# Patient Record
Sex: Male | Born: 1985 | Hispanic: Yes | Marital: Married | Smoking: Current some day smoker
Health system: Southern US, Community
[De-identification: ages and names within clinical notes are randomized; demographics above are authoritative.]

## PROBLEM LIST (undated history)

## (undated) ENCOUNTER — Emergency Department (HOSPITAL_COMMUNITY): Admission: EM | Payer: Self-pay

## (undated) DIAGNOSIS — K5792 Diverticulitis of intestine, part unspecified, without perforation or abscess without bleeding: Secondary | ICD-10-CM

## (undated) DIAGNOSIS — J309 Allergic rhinitis, unspecified: Secondary | ICD-10-CM

## (undated) DIAGNOSIS — K635 Polyp of colon: Secondary | ICD-10-CM

## (undated) DIAGNOSIS — K219 Gastro-esophageal reflux disease without esophagitis: Secondary | ICD-10-CM

## (undated) DIAGNOSIS — R1312 Dysphagia, oropharyngeal phase: Secondary | ICD-10-CM

## (undated) DIAGNOSIS — E119 Type 2 diabetes mellitus without complications: Secondary | ICD-10-CM

## (undated) HISTORY — DX: Allergic rhinitis, unspecified: J30.9

## (undated) HISTORY — DX: Gastro-esophageal reflux disease without esophagitis: K21.9

## (undated) HISTORY — DX: Polyp of colon: K63.5

## (undated) HISTORY — DX: Dysphagia, oropharyngeal phase: R13.12

## (undated) HISTORY — PX: ABDOMINAL HYSTERECTOMY: SHX81

---

## 2017-02-03 ENCOUNTER — Encounter (HOSPITAL_COMMUNITY): Payer: Self-pay | Admitting: Emergency Medicine

## 2017-02-03 ENCOUNTER — Emergency Department (HOSPITAL_COMMUNITY)
Admission: EM | Admit: 2017-02-03 | Discharge: 2017-02-03 | Disposition: A | Discharge status: Home / Self Care | Payer: Self-pay | Attending: Emergency Medicine | Admitting: Emergency Medicine

## 2017-02-03 DIAGNOSIS — R350 Frequency of micturition: Secondary | ICD-10-CM | POA: Insufficient documentation

## 2017-02-03 DIAGNOSIS — R631 Polydipsia: Secondary | ICD-10-CM | POA: Insufficient documentation

## 2017-02-03 DIAGNOSIS — E119 Type 2 diabetes mellitus without complications: Secondary | ICD-10-CM | POA: Insufficient documentation

## 2017-02-03 LAB — CBC
HEMATOCRIT: 40.8 % (ref 39.0–52.0)
Hemoglobin: 14.9 g/dL (ref 13.0–17.0)
MCH: 30.5 pg (ref 26.0–34.0)
MCHC: 36.5 g/dL — ABNORMAL HIGH (ref 30.0–36.0)
MCV: 83.4 fL (ref 78.0–100.0)
PLATELETS: 238 10*3/uL (ref 150–400)
RBC: 4.89 MIL/uL (ref 4.22–5.81)
RDW: 12.6 % (ref 11.5–15.5)
WBC: 5.2 10*3/uL (ref 4.0–10.5)

## 2017-02-03 LAB — URINALYSIS, ROUTINE W REFLEX MICROSCOPIC
BACTERIA UA: NONE SEEN
Bilirubin Urine: NEGATIVE
Ketones, ur: 20 mg/dL — AB
Leukocytes, UA: NEGATIVE
Nitrite: NEGATIVE
PROTEIN: NEGATIVE mg/dL
Specific Gravity, Urine: 1.033 — ABNORMAL HIGH (ref 1.005–1.030)
pH: 5 (ref 5.0–8.0)

## 2017-02-03 LAB — I-STAT VENOUS BLOOD GAS, ED
Acid-base deficit: 3 mmol/L — ABNORMAL HIGH (ref 0.0–2.0)
Bicarbonate: 21.3 mmol/L (ref 20.0–28.0)
O2 SAT: 83 %
PCO2 VEN: 36.4 mmHg — AB (ref 44.0–60.0)
PH VEN: 7.374 (ref 7.250–7.430)
PO2 VEN: 47 mmHg — AB (ref 32.0–45.0)
Patient temperature: 98.3
TCO2: 22 mmol/L (ref 0–100)

## 2017-02-03 LAB — COMPREHENSIVE METABOLIC PANEL
ALT: 58 U/L (ref 17–63)
AST: 32 U/L (ref 15–41)
Albumin: 4.1 g/dL (ref 3.5–5.0)
Alkaline Phosphatase: 98 U/L (ref 38–126)
Anion gap: 14 (ref 5–15)
BUN: 11 mg/dL (ref 6–20)
CHLORIDE: 96 mmol/L — AB (ref 101–111)
CO2: 19 mmol/L — AB (ref 22–32)
CREATININE: 0.91 mg/dL (ref 0.61–1.24)
Calcium: 9.2 mg/dL (ref 8.9–10.3)
GFR calc Af Amer: >60 mL/min (ref >60)
GFR calc non Af Amer: >60 mL/min (ref >60)
Glucose, Bld: 587 mg/dL (ref 65–99)
POTASSIUM: 4.3 mmol/L (ref 3.5–5.1)
SODIUM: 129 mmol/L — AB (ref 135–145)
Total Bilirubin: 1.2 mg/dL (ref 0.3–1.2)
Total Protein: 7.2 g/dL (ref 6.5–8.1)

## 2017-02-03 LAB — LIPASE, BLOOD: LIPASE: 28 U/L (ref 11–51)

## 2017-02-03 LAB — CBG MONITORING, ED
GLUCOSE-CAPILLARY: 282 mg/dL — AB (ref 65–99)
Glucose-Capillary: 459 mg/dL — ABNORMAL HIGH (ref 65–99)

## 2017-02-03 MED ORDER — METFORMIN HCL 500 MG PO TABS
500.0000 mg | ORAL_TABLET | Freq: Once | ORAL | Status: AC
  Administered 2017-02-03: 500 mg via ORAL
  Filled 2017-02-03: qty 1

## 2017-02-03 MED ORDER — SODIUM CHLORIDE 0.9 % IV BOLUS (SEPSIS)
1000.0000 mL | Freq: Once | INTRAVENOUS | Status: AC
  Administered 2017-02-03: 1000 mL via INTRAVENOUS

## 2017-02-03 MED ORDER — METFORMIN HCL 500 MG PO TABS: 500.0000 mg | ORAL_TABLET | Freq: Two times a day (BID) | ORAL | 0 refills | Status: DC

## 2017-02-03 NOTE — ED Provider Notes (Signed)
[facility] DEPT Provider Note   CSN: [id number] Arrival date & time: 02/03/17  1101     History   Chief Complaint Chief Complaint  Patient presents with  . Emesis  . Abdominal Pain  . Urinary Frequency    HPI Joshua Wall is a 31 y.o. male.  HPI   Pt is a 31 yo male with no PMH who presents the ED with multiple complaints. Patient reports over the past 2 weeks he has been having intermittent nausea, vomiting, urinary frequency and increased thirst. He reports only having episodes of vomiting after drinking fluids. He also states he has been having intermittent episodes of lightheadedness and blurred vision that typically occur when he is outside in the sun. He notes over the past 2-3 days he has been having mild diffuse abdominal discomfort but currently denies any specific pain at this time. He also reports having a 20 pound weight loss over the past 2 weeks. Denies taking any medications at home for his symptoms. Denies fever, chills, headache, cough, shortness of breath, chest pain, hematemesis, diarrhea, constipation, dysuria, blood in urine or stool, penile discharge, rash, numbness, tingling, weakness, syncope. Denies any known sick contacts.  History reviewed. No pertinent past medical history.  There are no active problems to display for this patient.   History reviewed. No pertinent surgical history.     Home Medications    Prior to Admission medications   Medication Sig Start Date End Date Taking? Authorizing Provider  metFORMIN (GLUCOPHAGE) 500 MG tablet Take 1 tablet (500 mg total) by mouth 2 (two) times daily with a meal. 02/03/17   Nona Dell, PA-C    Family History History reviewed. No pertinent family history.  Social History Social History  Substance Use Topics  . Smoking status: Never Smoker  . Smokeless tobacco: Never Used  . Alcohol use No     Allergies   Patient has no known allergies.   Review of Systems Review of  Systems  Constitutional: Positive for unexpected weight change.  Eyes: Positive for visual disturbance (blurred).  Gastrointestinal: Positive for abdominal pain, nausea and vomiting.  Genitourinary: Positive for frequency.  Neurological: Positive for light-headedness.  All other systems reviewed and are negative.    Physical Exam Updated Vital Signs BP 132/76   Pulse 68   Temp 98.3 F (36.8 C) (Oral)   Resp 16   Ht 5\' 8"  (1.727 m)   Wt 104.2 kg (229 lb 11.5 oz)   SpO2 97%   BMI 34.93 kg/m   Physical Exam  Constitutional: He is oriented to person, place, and time. He appears well-developed and well-nourished. No distress.  HENT:  Head: Normocephalic and atraumatic.  Mouth/Throat: Oropharynx is clear and moist. No oropharyngeal exudate.  Eyes: Conjunctivae and EOM are normal. Pupils are equal, round, and reactive to light. Right eye exhibits no discharge. Left eye exhibits no discharge. No scleral icterus.  Neck: Normal range of motion. Neck supple.  Cardiovascular: Normal rate, regular rhythm, normal heart sounds and intact distal pulses.   Pulmonary/Chest: Effort normal and breath sounds normal. No respiratory distress. He has no wheezes. He has no rales. He exhibits no tenderness.  Abdominal: Soft. Bowel sounds are normal. He exhibits no distension and no mass. There is no tenderness. There is no rebound and no guarding. No hernia.  Musculoskeletal: Normal range of motion. He exhibits no edema.  Neurological: He is alert and oriented to person, place, and time. He has normal strength. No cranial nerve deficit  or sensory deficit. Coordination normal.  Skin: Skin is warm and dry. He is not diaphoretic.  Nursing note and vitals reviewed.    ED Treatments / Results  Labs (all labs ordered are listed, but only abnormal results are displayed) Labs Reviewed  COMPREHENSIVE METABOLIC PANEL - Abnormal; Notable for the following:       Result Value   Sodium 129 (*)    Chloride 96  (*)    CO2 19 (*)    Glucose, Bld 587 (*)    All other components within normal limits  CBC - Abnormal; Notable for the following:    MCHC 36.5 (*)    All other components within normal limits  URINALYSIS, ROUTINE W REFLEX MICROSCOPIC - Abnormal; Notable for the following:    Color, Urine STRAW (*)    Specific Gravity, Urine 1.033 (*)    Glucose, UA >=500 (*)    Hgb urine dipstick SMALL (*)    Ketones, ur 20 (*)    Squamous Epithelial / LPF 0-5 (*)    All other components within normal limits  CBG MONITORING, ED - Abnormal; Notable for the following:    Glucose-Capillary 459 (*)    All other components within normal limits  I-STAT VENOUS BLOOD GAS, ED - Abnormal; Notable for the following:    pCO2, Ven 36.4 (*)    pO2, Ven 47.0 (*)    Acid-base deficit 3.0 (*)    All other components within normal limits  CBG MONITORING, ED - Abnormal; Notable for the following:    Glucose-Capillary 282 (*)    All other components within normal limits  LIPASE, BLOOD    EKG  EKG Interpretation None       Radiology No results found.  Procedures Procedures (including critical care time)  Medications Ordered in ED Medications  sodium chloride 0.9 % bolus 1,000 mL (0 mLs Intravenous Stopped 02/03/17 1356)  metFORMIN (GLUCOPHAGE) tablet 500 mg (500 mg Oral Given 02/03/17 1544)     Initial Impression / Assessment and Plan / ED Course  I have reviewed the triage vital signs and the nursing notes.  Pertinent labs & imaging results that were available during my care of the patient were reviewed by me and considered in my medical decision making (see chart for details).    Patient presents with multiple symptoms including abdominal pain, nausea, vomiting, lightheadedness, urinary frequency and polydipsia that has been present for the past 2 weeks. Patient denies any known medical history. VSS. Exam unremarkable, abdomen soft and nontender. Remaining exam unremarkable. Patient given IV fluids  in the ED. Glucose 587, no anion gap. UA positive for glucose, ketones, no signs of infection. VBG showed pH 7.3, CO2 36.4, bicarb 21.3. Discussed pt with Dr. Laverta Baltimore. Due to pt with new onset DM, pt without PCP and workup concerning for possible early DKA, will consult hospitalist for further management. I spoke with Dr. Benjamine Mola (internal medicine); due to pt without workup showing evidence of DKA, plan to start pt on metformin with plan for him to f/u at [facility] clinic tomorrow for follow up and further management of new onset DM. Discussed results and plan with pt and family at bedside. Repeat CBG 282. Pt given initial dose of metformin in the ED and d/c home with rx. Discussed return precautions.   Final Clinical Impressions(s) / ED Diagnoses   Final diagnoses:  Diabetes mellitus, new onset (HCC)    New Prescriptions New Prescriptions   METFORMIN (GLUCOPHAGE) 500 MG TABLET  Take 1 tablet (500 mg total) by mouth 2 (two) times daily with a meal.     Nona Dell, PA-C 02/03/17 1552    Margette Fast, MD 02/04/17 [phone]

## 2017-02-03 NOTE — ED Notes (Signed)
Admitting team at bedside.

## 2017-02-03 NOTE — Discharge Instructions (Signed)
Take your medication as prescribed. Follow-up at your scheduled appointment tomorrow at the internal medicine clinic at the [facility]. Please return to the Emergency Department if symptoms worsen or new onset of fever, chest pain, difficulty breathing, abdominal pain, vomiting, unable to keep fluids down, confusion, altered mental status, syncope, seizure.

## 2017-02-03 NOTE — ED Triage Notes (Signed)
Onset 2 weeks ago developed nausea, vomiting, general abdominal pain, weight loss of 20 pounds, near syncope, blurry vision,  urinary frequency, and pain tip of penis. States general abdominal pain 2/10 achy sore.

## 2017-02-03 NOTE — ED Notes (Signed)
Mathis Bud PA notified of critical Glucose 587.

## 2017-02-03 NOTE — ED Notes (Signed)
Patient given something to drink per EDP approval  

## 2017-02-04 ENCOUNTER — Encounter: Payer: Self-pay | Admitting: Internal Medicine

## 2017-02-04 ENCOUNTER — Other Ambulatory Visit (HOSPITAL_COMMUNITY): Admission: RE | Admit: 2017-02-04 | Payer: Self-pay | Source: Ambulatory Visit | Admitting: Internal Medicine

## 2017-02-04 ENCOUNTER — Ambulatory Visit (INDEPENDENT_AMBULATORY_CARE_PROVIDER_SITE_OTHER): Payer: Self-pay | Admitting: Internal Medicine

## 2017-02-04 VITALS — BP 120/82 | HR 91 | Temp 98.0°F | Wt 234.4 lb

## 2017-02-04 DIAGNOSIS — E119 Type 2 diabetes mellitus without complications: Secondary | ICD-10-CM

## 2017-02-04 DIAGNOSIS — B3742 Candidal balanitis: Secondary | ICD-10-CM

## 2017-02-04 LAB — POCT GLYCOSYLATED HEMOGLOBIN (HGB A1C): HEMOGLOBIN A1C: 13.1

## 2017-02-04 LAB — GLUCOSE, CAPILLARY: GLUCOSE-CAPILLARY: 393 mg/dL — AB (ref 65–99)

## 2017-02-04 MED ORDER — INSULIN DETEMIR 100 UNIT/ML ~~LOC~~ SOLN: 10.0000 [IU] | Freq: Every day | SUBCUTANEOUS | 11 refills | Status: DC

## 2017-02-04 MED ORDER — INSULIN GLARGINE 100 UNIT/ML ~~LOC~~ SOLN
10.0000 [IU] | Freq: Once | SUBCUTANEOUS | Status: AC
  Administered 2017-02-04: 10 [IU] via SUBCUTANEOUS

## 2017-02-04 MED ORDER — CLOTRIMAZOLE-BETAMETHASONE 1-0.05 % EX CREA: 1.0000 "application " | TOPICAL_CREAM | Freq: Two times a day (BID) | CUTANEOUS | 0 refills | Status: AC

## 2017-02-04 NOTE — Progress Notes (Signed)
° °  CC: ED f/u HPI:  [name] is a 31 y.o. with recent dx of new onset DM who presents for ED follow up.   On 02/03/17, the pt presented to the [facility] ED with several weeks of increased urinary frequency, thirst, intermittent n/v and blurred vision. He also had a 20 lb weight loss over that time. His initial blood glucose was 587 with urine ketones, though there was no anion gap. He was hydrated with IV fluids and started on Metformin. Bg decreased to 282 prior to discharge.   Since leaving the ED, he reports improved nausea and abd pain, intermittent but improving blurred vision. His urinary frequency has decreased but he continues to feel increased thirst. He has taken two doses of metformin without side effects.   He also notes a recent history of urethral discharge and itchiness. He used an OTC topical medication (unable to remember name) with resolution of urethral discharge but still notes some redness and discomfort with retraction.    Past Medical History: No past medical history reported. No surgical history.  Past Family History: Father positive for HTN, HLD. Otherwise negative.  Social History: Married, sexually active. Occasional alcohol use, quit smoking 6 months after 4 year smoking history, denies other drug use.    Review of Systems:  Review of Systems  Constitutional: Positive for weight loss. Negative for chills and fever.  HENT: Negative for ear discharge, ear pain and hearing loss.   Eyes: Positive for blurred vision. Negative for pain and discharge.  Respiratory: Negative for cough and shortness of breath.   Cardiovascular: Negative for chest pain and palpitations.  Gastrointestinal: Negative for constipation, nausea and vomiting.  Genitourinary: Positive for frequency. Negative for flank pain and hematuria.  Musculoskeletal: Negative for joint pain and myalgias.  Neurological: Negative for tingling, sensory change and focal weakness.  Endo/Heme/Allergies:  Positive for polydipsia. Does not bruise/bleed easily.  Psychiatric/Behavioral: Negative for hallucinations and substance abuse.    Physical Exam:  Vitals:   02/04/17 1518  BP: 120/82  Pulse: 91  Temp: 98 F (36.7 C)  TempSrc: Oral  SpO2: 99%  Weight: 234 lb 6.4 oz (106.3 kg)   Physical Exam  Constitutional: He is oriented to person, place, and time. He appears well-developed and well-nourished.  HENT:  Head: Normocephalic and atraumatic.  Mouth/Throat: Oropharynx is clear and moist. No oropharyngeal exudate.  Eyes: Conjunctivae are normal. Right eye exhibits no discharge. Left eye exhibits no discharge.  Cardiovascular: Normal rate, regular rhythm and intact distal pulses.   Pulmonary/Chest: Effort normal and breath sounds normal. No respiratory distress. He has no wheezes.  Abdominal: Soft. Bowel sounds are normal. He exhibits no distension. There is no tenderness. There is no rebound and no guarding.  Genitourinary:  Genitourinary Comments: Uncircumcised penis with erythema at distal tip, able to fully retract, white discharge around glans penis, no discharge at urethral meatus   Musculoskeletal: Normal range of motion.  Neurological: He is alert and oriented to person, place, and time. No sensory deficit. He exhibits normal muscle tone.  Skin: Skin is warm and dry. Capillary refill takes less than 2 seconds.  Psychiatric: He has a normal mood and affect. His behavior is normal.    Assessment & Plan:   See Encounters Tab for problem based charting.  Patient seen with Dr. Daryll Drown

## 2017-02-04 NOTE — Assessment & Plan Note (Addendum)
Candidal Balanitis Assessment: Candidal balanitis is likely due to recent glucosuria. Whitish discharge mostly around glans rather than from urethral meatus, but will check GC to r/o other STI.   Plan:  -Clotrimazole 1% BID x 7 days -Hygiene -U/A with Urine GC

## 2017-02-04 NOTE — Patient Instructions (Addendum)
It was very nice to meet you this afternoon Joshua Wall  For your insulin, inject 10 U of Levemir insulin each night and check your blood sugar in the morning before breakfast.   If you start to experience stomach pain, nausea, vomiting, dizziness, or other symptoms you had before you went to the Emergency Department, please return to the ED for treatment.   Start to adopt healthy eating habits and exercising. You will be able to meet with a nutrition specialist for more details very soon.   For your yeast infection: Apply clotrimazole 1% twice a day to the head of the penis and affected skin for 1 week. If it has not improved or has worsened after 1 week, come back to the clinic.

## 2017-02-04 NOTE — Assessment & Plan Note (Addendum)
DM Assessment: Newly diagnosed DM2 after presenting to ED. Today, his bg is 393, and A1c is 13.1. He has already been started on Metformin which he is initially tolerating, can consider increasing dose in future. He is also eligible to start insulin therapy which is likely to be most effective and affordable for him and he has been educated on technique of insulin injections and glucose monitoring. Depending on treatment response, lifestyle changes, and financial options, he may be able to transition to other forms of therapy in the future.     Plan: -One time 10 U dose of Lantus in clinic  -Start Levemir 10 U qhs, check bg in am and keep log -Continue Metformin 500 mg BID -Encourage lifestyle changes  -Microalbumin: Cr ratio  -Referral to Diabetes and Nutrition education, Financial services  -F/u in two weeks to reassess

## 2017-02-05 ENCOUNTER — Ambulatory Visit: Payer: Self-pay

## 2017-02-05 LAB — URINE CYTOLOGY ANCILLARY ONLY
Chlamydia: NEGATIVE
NEISSERIA GONORRHEA: NEGATIVE

## 2017-02-05 LAB — MICROSCOPIC EXAMINATION
BACTERIA UA: NONE SEEN
Casts: NONE SEEN /lpf

## 2017-02-05 LAB — URINALYSIS, COMPLETE
BILIRUBIN UA: NEGATIVE
LEUKOCYTES UA: NEGATIVE
NITRITE UA: NEGATIVE
Urobilinogen, Ur: 0.2 mg/dL (ref 0.2–1.0)
pH, UA: 5 (ref 5.0–7.5)

## 2017-02-05 LAB — MICROALBUMIN / CREATININE URINE RATIO
Creatinine, Urine: 67.4 mg/dL
MICROALB/CREAT RATIO: 181.3 mg/g{creat} — AB (ref 0.0–30.0)
Microalbumin, Urine: 122.2 ug/mL

## 2017-02-11 NOTE — Progress Notes (Signed)
Internal Medicine Clinic Attending  I saw and evaluated the patient.  I personally confirmed the key portions of the history and exam documented by Dr. Harden and I reviewed pertinent patient test results.  The assessment, diagnosis, and plan were formulated together and I agree with the documentation in the resident's note.  

## 2017-02-19 ENCOUNTER — Ambulatory Visit: Payer: Self-pay

## 2017-03-05 ENCOUNTER — Encounter: Payer: Self-pay | Admitting: Dietician

## 2017-03-05 ENCOUNTER — Ambulatory Visit (INDEPENDENT_AMBULATORY_CARE_PROVIDER_SITE_OTHER): Payer: Self-pay | Admitting: Internal Medicine

## 2017-03-05 ENCOUNTER — Ambulatory Visit (INDEPENDENT_AMBULATORY_CARE_PROVIDER_SITE_OTHER): Payer: Self-pay | Admitting: Dietician

## 2017-03-05 ENCOUNTER — Ambulatory Visit: Payer: Self-pay

## 2017-03-05 VITALS — BP 112/75 | HR 71 | Temp 97.7°F | Ht 68.5 in | Wt 232.4 lb

## 2017-03-05 DIAGNOSIS — E119 Type 2 diabetes mellitus without complications: Secondary | ICD-10-CM

## 2017-03-05 DIAGNOSIS — Z794 Long term (current) use of insulin: Secondary | ICD-10-CM

## 2017-03-05 DIAGNOSIS — Z713 Dietary counseling and surveillance: Secondary | ICD-10-CM

## 2017-03-05 DIAGNOSIS — Z7984 Long term (current) use of oral hypoglycemic drugs: Secondary | ICD-10-CM

## 2017-03-05 DIAGNOSIS — B3742 Candidal balanitis: Secondary | ICD-10-CM

## 2017-03-05 LAB — GLUCOSE, CAPILLARY: GLUCOSE-CAPILLARY: 186 mg/dL — AB (ref 65–99)

## 2017-03-05 MED ORDER — "INSULIN SYRINGE-NEEDLE U-100 31G X 15/64"" 0.3 ML MISC": 10.0000 [IU] | Freq: Every day | 3 refills | Status: DC

## 2017-03-05 MED ORDER — METFORMIN HCL 1000 MG PO TABS: 1000.0000 mg | ORAL_TABLET | Freq: Two times a day (BID) | ORAL | 3 refills | Status: DC

## 2017-03-05 NOTE — Patient Instructions (Signed)
Your blood sugar was 186 after lunch. This is very close to target after meals of less than 180!  Way to go!  Good job taking care of your diabetes!

## 2017-03-05 NOTE — Assessment & Plan Note (Signed)
The patient has had a great response to his diabetes diagnosis with good dietary and exercise changes. His wife is also very involved and motivated with these changes. He has been using 10 units of Levemir nightly without symptoms of hypoglycemia and a steady decrease his morning blood sugars. His most recent values are in a good target range. His initial labs from prior visit which show some proteinuria, he is normotensive so there is no indication for ACE inhibitor at this time and his proteinuria may improve with improved glycemic control and treatment. He has had no GI side effects from metformin and can tolerate increased dose.   -Increase metformin to 1000 mg twice a day -Continue Levemir 10 units nightly -Continue to encourage lifestyle modifications -Follow-up in 2 months

## 2017-03-05 NOTE — Patient Instructions (Addendum)
Good to see you again Joshua Wall.   Keep up the great work with exercise and eating healthy to help your diabetes. You have refills for your insulin available at the [facility] when you are running low. We also sent in for refill of the Metformin and for syringes.   For the Metformin, start to take a half tablet at breakfast and a full tablet at dinner for one week.   Then, take one full tablet at breakfast and one full tablet at dinner.   I'll see you back in two months to see how you're doing.

## 2017-03-05 NOTE — Progress Notes (Signed)
   CC: Diabetes follow up   HPI:  [name] is a 31 y.o. M with past medical history of diabetes who presents to the clinic for follow-up of his diabetes.   [name] was initially seen for his diabetes 7/2 after being diagnosed with an A1c of 13.1. He was started on 10 units of Levemir nightly, and metformin. Since then he reports he does been feeling well overall and denies symptoms of hypoglycemia. He has blood glucose measurements with him today which reveal a downward trend from his diagnosis until now with most recent readings around 150. He and his wife have also been working with lifestyle modifications by eating a healthier diet and exercise.  He was also diagnosed with candidal balanitis at the time and prescribed topical clotrimazole. He reports a whitish discharge and discomfort have resolved, but there is a small amount of skin breakdown that has not fully healed.   Review of Systems:  Review of Systems  Constitutional: Negative for fever.  Gastrointestinal: Negative for abdominal pain.  Genitourinary: Negative for dysuria and frequency.     Physical Exam:  Vitals:   03/05/17 1539  BP: 112/75  Pulse: 71  Temp: 97.7 F (36.5 C)  TempSrc: Oral  SpO2: 98%  Weight: 232 lb 6.4 oz (105.4 kg)  Height: 5' 8.5" (1.74 m)   Physical Exam  Constitutional: He appears well-developed and well-nourished.  Cardiovascular: Normal rate and regular rhythm.   Pulmonary/Chest: Effort normal and breath sounds normal.  Abdominal: Soft. There is no tenderness.  Skin: Skin is warm and dry. Capillary refill takes less than 2 seconds.    Assessment & Plan:   See Encounters Tab for problem based charting.  Patient seen with Dr. Evette Doffing

## 2017-03-05 NOTE — Assessment & Plan Note (Signed)
His discharge and discomfort have improved with topical clotrimazole. Reported areas of skin breakdown have also improved but still need time to fully heal. Counseled patient that antifungal cream is no longer necessary with resolution of infection.

## 2017-03-05 NOTE — Progress Notes (Signed)
Needs refills on Metformin today. Seeing Doctor and working with Development worker, community.  Diabetes Self-Management Education  Visit Type: First/Initial  Appt. Start Time: 115 Appt. End Time: 215  03/05/2017  [name], identified by name and date of birth, is a 31 y.o. male with a diagnosis of Diabetes: Type 2.  He is here with his wife and daughter, Joshua Wall  ASSESSMENT  Blood pressure 114/82, pulse 81, temperature 98.3 F (36.8 C), temperature source Oral, weight 232 lb 9.6 oz (105.5 kg), SpO2 98 %. Body mass index is 35.37 kg/m.      Diabetes Self-Management Education - 03/05/17 1400      Visit Information   Visit Type First/Initial     Initial Visit   Diabetes Type Type 2   Are you currently following a meal plan? Yes   What type of meal plan do you follow? lower fat, limited carbs   Are you taking your medications as prescribed? Yes     Health Coping   How would you rate your overall health? Good     Psychosocial Assessment   Patient Belief/Attitude about Diabetes Motivated to manage diabetes   Self-care barriers Lack of material resources   Self-management support Doctor's office;Family;CDE visits   Other persons present Spouse/SO;Family Member   Patient Concerns Nutrition/Meal planning   Special Needs None  wife translates for him   Preferred Learning Style No preference indicated   Learning Readiness Change in progress   How often do you need to have someone help you when you read instructions, pamphlets, or other written materials from your doctor or pharmacy? 3 - Sometimes  if it is in [place]     Pre-Education Assessment   Patient understands the diabetes disease and treatment process. Needs Instruction   Patient understands incorporating nutritional management into lifestyle. Needs Instruction   Patient undertands incorporating physical activity into lifestyle. Needs Instruction   Patient understands using medications safely. Needs  Instruction   Patient understands monitoring blood glucose, interpreting and using results Needs Instruction   Patient understands prevention, detection, and treatment of acute complications. Needs Instruction   Patient understands prevention, detection, and treatment of chronic complications. Needs Instruction   Patient understands how to develop strategies to address psychosocial issues. Demonstrates understanding / competency   Patient understands how to develop strategies to promote health/change behavior. Demonstrates understanding / competency      Individualized Plan for Diabetes Self-Management Training:   Learning Objective:  Patient will have a greater understanding of diabetes self-management. Patient education plan is to attend individual and/or group sessions per assessed needs and concerns.  My plan to support myself in continuing these changes to care for my diabetes is to attend or contact:   Type 2 diabetes support group : 2nd Monday of every month from 6-7 PM at [address] [facility] conference room [phone]   Doctor's office, CDE, Microbiologist, pharmacist, church .  Plan:   Patient Instructions  Your blood sugar was 186 after lunch. This is very close to target after meals of less than 180!  Way to go!  Good job taking care of your diabetes!   Expected Outcomes:     Education material provided: Meal plan card  If problems or questions, patient to contact team via:  Phone  Future DSME appointment:   3-4 weeks [place], [place] 03/05/2017 3:03 PM.

## 2017-03-06 NOTE — Progress Notes (Signed)
Internal Medicine Clinic Attending  I saw and evaluated the patient.  I personally confirmed the key portions of the history and exam documented by Dr. Harden and I reviewed pertinent patient test results.  The assessment, diagnosis, and plan were formulated together and I agree with the documentation in the resident's note.  

## 2017-04-01 ENCOUNTER — Ambulatory Visit: Payer: Self-pay | Admitting: Dietician

## 2017-05-13 ENCOUNTER — Encounter: Payer: Self-pay | Admitting: Internal Medicine

## 2017-07-01 ENCOUNTER — Encounter: Payer: Self-pay | Admitting: Internal Medicine

## 2017-07-01 ENCOUNTER — Ambulatory Visit: Payer: Self-pay | Admitting: Internal Medicine

## 2017-07-01 ENCOUNTER — Other Ambulatory Visit: Payer: Self-pay

## 2017-07-01 VITALS — BP 119/73 | HR 68 | Temp 98.0°F | Ht 68.5 in | Wt 252.9 lb

## 2017-07-01 DIAGNOSIS — Z6837 Body mass index (BMI) 37.0-37.9, adult: Secondary | ICD-10-CM

## 2017-07-01 DIAGNOSIS — Z23 Encounter for immunization: Secondary | ICD-10-CM

## 2017-07-01 DIAGNOSIS — B351 Tinea unguium: Secondary | ICD-10-CM

## 2017-07-01 DIAGNOSIS — F17210 Nicotine dependence, cigarettes, uncomplicated: Secondary | ICD-10-CM

## 2017-07-01 DIAGNOSIS — R635 Abnormal weight gain: Secondary | ICD-10-CM

## 2017-07-01 DIAGNOSIS — E119 Type 2 diabetes mellitus without complications: Secondary | ICD-10-CM

## 2017-07-01 DIAGNOSIS — Z7984 Long term (current) use of oral hypoglycemic drugs: Secondary | ICD-10-CM

## 2017-07-01 DIAGNOSIS — R7303 Prediabetes: Secondary | ICD-10-CM

## 2017-07-01 LAB — POCT GLYCOSYLATED HEMOGLOBIN (HGB A1C): Hemoglobin A1C: 5.8

## 2017-07-01 LAB — GLUCOSE, CAPILLARY: Glucose-Capillary: 119 mg/dL — ABNORMAL HIGH (ref 65–99)

## 2017-07-01 MED ORDER — GLUCOSE BLOOD VI STRP: ORAL_STRIP | 12 refills | Status: DC

## 2017-07-01 MED ORDER — TERBINAFINE HCL 250 MG PO TABS: 250.0000 mg | ORAL_TABLET | Freq: Every day | ORAL | 0 refills | Status: AC

## 2017-07-01 MED ORDER — FREESTYLE LANCETS MISC: 12 refills | Status: DC

## 2017-07-01 NOTE — Patient Instructions (Signed)
Great to meet you today.  Your diabetes looks great today your A1C was way down to 5.8.  We will discontinue the Levemir but continue the metformin for now.  I have also written for a medication to help with your toenail fungus.  Fungus likes two things sugar and moisture.  We have taken care of the sugar now we need to concentrate on keeping your feet dry so the medication can work.  Continue taking your blood sugars once a day to keep an eye on your diabetes.  We will see how you've been doing in 3 months.

## 2017-07-01 NOTE — Progress Notes (Signed)
   CC: onchomycosis of bilateral toenails, Weight Gain, Follow up on T2DM  HPI:  [name] is a 31 y.o. with no real PMH other than recently diagnosed T2DM diagnosed in July of this year.  He is here to address some chronic toenail fungus, recent weight gain and his T2DM.  He was seen in the ED in July 2018 for abdominal pain, polyuria, polydipsia and weight loss, was found to have a glucose of 587 and A1C of 13.1.  Since then he has been placed on metformin 2000mg  daily and levemir 10 units daily.  He reports good adherence with this for two months and improvement in blood sugars.  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  He has continued to drastically change his diet and tries to exercise some but not much.    Please see A&P for status of the patient's chronic medical conditions  History reviewed. No pertinent past medical history. Review of Systems:  ROS: Pulmonary: pt denies increased work of breathing, shortness of breath,  Cardiac: pt denies palpitations, chest pain,  Abdominal: pt denies abdominal pain, nausea, vomiting, or diarrhea  Physical Exam:  Vitals:   07/01/17 1513  BP: 119/73  Pulse: 68  Temp: 98 F (36.7 C)  TempSrc: Oral  SpO2: 98%  Weight: 252 lb 14.4 oz (114.7 kg)  Height: 5' 8.5" (1.74 m)   Physical Exam  Constitutional: He appears well-developed and well-nourished.  Eyes: Right eye exhibits no discharge. Left eye exhibits no discharge. No scleral icterus.  Cardiovascular: Normal rate, regular rhythm, normal heart sounds and intact distal pulses. Exam reveals no gallop and no friction rub.  No murmur heard. Pulmonary/Chest: Effort normal and breath sounds normal. No respiratory distress. He has no wheezes. He has no rales.  Abdominal: Soft. Bowel sounds are  normal. He exhibits no distension and no mass. There is no tenderness. There is no guarding.  Neurological: He is alert.  Skin:  onchomycosis of bilateral first and second toenails.    Social History   Socioeconomic History  . Marital status: Married    Spouse name: Not on file  . Number of children: Not on file  . Years of education: Not on file  . Highest education level: Not on file  Social Needs  . Financial resource strain: Not on file  . Food insecurity - worry: Not on file  . Food insecurity - inability: Not on file  . Transportation needs - medical: Not on file  . Transportation needs - non-medical: Not on file  Occupational History  . Not on file  Tobacco Use  . Smoking status: Current Some Day Smoker    Types: Cigarettes  . Smokeless tobacco: Never Used  . Tobacco comment: 3 cigarettes per week.  Substance and Sexual Activity  . Alcohol use: Yes    Comment: Sometimes.  . Drug use: No  . Sexual activity: Not on file  Other Topics Concern  . Not on file  Social History Narrative  . Not on file    History reviewed. No pertinent family history.  Assessment & Plan:   See Encounters Tab for problem based charting.  Patient seen with Dr. Rebeca Alert

## 2017-07-02 DIAGNOSIS — B351 Tinea unguium: Secondary | ICD-10-CM | POA: Insufficient documentation

## 2017-07-02 NOTE — Progress Notes (Signed)
Internal Medicine Clinic Attending  I saw and evaluated the patient.  I personally confirmed the key portions of the history and exam documented by Dr. Shan Levans and I reviewed pertinent patient test results.  The assessment, diagnosis, and plan were formulated together and I agree with the documentation in the resident's note.  Startling reversal in his A1C from 13 to 5.8, despite being off medication for the past 2 months. Will continue metformin for prediabetes but can stop insulin for now since he has not been using it. Will have him continue to check sugars and return in 3 months for recheck of A1C.  Oda Kilts, MD

## 2017-07-02 NOTE — Assessment & Plan Note (Signed)
A1C is 5.8 today down from 13.1 in July.  He was seen in the ED in July 2018 for abdominal pain, polyuria, polydipsia and weight loss, was found to have a glucose of 587 and A1C of 13.1.  Since then he has been placed on metformin 2000mg  daily and levemir 10 units daily.  He reports good adherence with this for two months and improvement in blood sugars.  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  He has continued to drastically change his diet and tries to exercise some but not much. He also reports weight gain but on his visit to the ED he reported a 20 pound weight loss before that visit and he is now 20 pounds heavier.  I expect that he has regained his original weight once his metabolic activity normalized.  I feel that this dramatic reversal of pts hyperglycemia is due to reversal of the patient's glucose toxicity of the pancreas.  Given the patient's young age, when he changed his diet and took metformin/levemir for those initial 2 months I expect he allowed his pancreas to recover and begin making insulin again.    -Pt still in prediabetes although on no meds for last two months. -Will continue metformin 2000mg  daily and discontinue levemir for now and have pt follow up in 3 months while checking his blood sugars at home daily.

## 2017-07-08 ENCOUNTER — Ambulatory Visit: Payer: Self-pay

## 2017-07-16 ENCOUNTER — Ambulatory Visit: Payer: Self-pay

## 2017-08-12 ENCOUNTER — Ambulatory Visit: Payer: Self-pay

## 2017-11-04 DIAGNOSIS — K5792 Diverticulitis of intestine, part unspecified, without perforation or abscess without bleeding: Secondary | ICD-10-CM

## 2017-11-04 HISTORY — DX: Diverticulitis of intestine, part unspecified, without perforation or abscess without bleeding: K57.92

## 2017-11-20 ENCOUNTER — Inpatient Hospital Stay (HOSPITAL_COMMUNITY)
Admission: EM | Admit: 2017-11-20 | Discharge: 2017-11-24 | DRG: 392 | Disposition: A | Discharge status: Home / Self Care | Payer: Medicaid Other | Attending: Internal Medicine | Admitting: Internal Medicine

## 2017-11-20 ENCOUNTER — Other Ambulatory Visit: Payer: Self-pay

## 2017-11-20 ENCOUNTER — Emergency Department (HOSPITAL_COMMUNITY): Payer: Medicaid Other

## 2017-11-20 ENCOUNTER — Encounter (HOSPITAL_COMMUNITY): Payer: Self-pay | Admitting: Emergency Medicine

## 2017-11-20 DIAGNOSIS — K5792 Diverticulitis of intestine, part unspecified, without perforation or abscess without bleeding: Secondary | ICD-10-CM | POA: Diagnosis present

## 2017-11-20 DIAGNOSIS — Z23 Encounter for immunization: Secondary | ICD-10-CM

## 2017-11-20 DIAGNOSIS — K5720 Diverticulitis of large intestine with perforation and abscess without bleeding: Principal | ICD-10-CM | POA: Diagnosis present

## 2017-11-20 DIAGNOSIS — E119 Type 2 diabetes mellitus without complications: Secondary | ICD-10-CM

## 2017-11-20 DIAGNOSIS — R7881 Bacteremia: Secondary | ICD-10-CM | POA: Diagnosis present

## 2017-11-20 DIAGNOSIS — B351 Tinea unguium: Secondary | ICD-10-CM | POA: Diagnosis present

## 2017-11-20 DIAGNOSIS — E876 Hypokalemia: Secondary | ICD-10-CM | POA: Diagnosis present

## 2017-11-20 DIAGNOSIS — Z8719 Personal history of other diseases of the digestive system: Secondary | ICD-10-CM | POA: Diagnosis present

## 2017-11-20 DIAGNOSIS — Z7984 Long term (current) use of oral hypoglycemic drugs: Secondary | ICD-10-CM

## 2017-11-20 DIAGNOSIS — D72829 Elevated white blood cell count, unspecified: Secondary | ICD-10-CM | POA: Diagnosis present

## 2017-11-20 DIAGNOSIS — F17210 Nicotine dependence, cigarettes, uncomplicated: Secondary | ICD-10-CM | POA: Diagnosis present

## 2017-11-20 HISTORY — DX: Type 2 diabetes mellitus without complications: E11.9

## 2017-11-20 HISTORY — DX: Diverticulitis of intestine, part unspecified, without perforation or abscess without bleeding: K57.92

## 2017-11-20 LAB — CBC WITH DIFFERENTIAL/PLATELET
Basophils Absolute: 0 10*3/uL (ref 0.0–0.1)
Basophils Relative: 0 %
EOS ABS: 0 10*3/uL (ref 0.0–0.7)
Eosinophils Relative: 0 %
HCT: 42.3 % (ref 39.0–52.0)
HEMOGLOBIN: 15.4 g/dL (ref 13.0–17.0)
LYMPHS ABS: 1.8 10*3/uL (ref 0.7–4.0)
Lymphocytes Relative: 14 %
MCH: 31 pg (ref 26.0–34.0)
MCHC: 36.4 g/dL — ABNORMAL HIGH (ref 30.0–36.0)
MCV: 85.1 fL (ref 78.0–100.0)
Monocytes Absolute: 0.7 10*3/uL (ref 0.1–1.0)
Monocytes Relative: 5 %
NEUTROS PCT: 81 %
Neutro Abs: 10.6 10*3/uL — ABNORMAL HIGH (ref 1.7–7.7)
Platelets: 224 10*3/uL (ref 150–400)
RBC: 4.97 MIL/uL (ref 4.22–5.81)
RDW: 13.3 % (ref 11.5–15.5)
WBC: 13.1 10*3/uL — AB (ref 4.0–10.5)

## 2017-11-20 LAB — URINALYSIS, ROUTINE W REFLEX MICROSCOPIC
BILIRUBIN URINE: NEGATIVE
Bacteria, UA: NONE SEEN
GLUCOSE, UA: NEGATIVE mg/dL
Hgb urine dipstick: NEGATIVE
KETONES UR: 5 mg/dL — AB
Leukocytes, UA: NEGATIVE
Nitrite: NEGATIVE
PROTEIN: 30 mg/dL — AB
Specific Gravity, Urine: 1.018 (ref 1.005–1.030)
pH: 6 (ref 5.0–8.0)

## 2017-11-20 LAB — COMPREHENSIVE METABOLIC PANEL
ALBUMIN: 4.1 g/dL (ref 3.5–5.0)
ALT: 37 U/L (ref 17–63)
AST: 21 U/L (ref 15–41)
Alkaline Phosphatase: 60 U/L (ref 38–126)
Anion gap: 12 (ref 5–15)
BUN: 6 mg/dL (ref 6–20)
CHLORIDE: 101 mmol/L (ref 101–111)
CO2: 20 mmol/L — ABNORMAL LOW (ref 22–32)
Calcium: 8.9 mg/dL (ref 8.9–10.3)
Creatinine, Ser: 0.75 mg/dL (ref 0.61–1.24)
GFR calc Af Amer: >60 mL/min (ref >60)
Glucose, Bld: 127 mg/dL — ABNORMAL HIGH (ref 65–99)
POTASSIUM: 3.3 mmol/L — AB (ref 3.5–5.1)
Sodium: 133 mmol/L — ABNORMAL LOW (ref 135–145)
Total Bilirubin: 1 mg/dL (ref 0.3–1.2)
Total Protein: 8.5 g/dL — ABNORMAL HIGH (ref 6.5–8.1)

## 2017-11-20 LAB — I-STAT CG4 LACTIC ACID, ED: LACTIC ACID, VENOUS: 1.68 mmol/L (ref 0.5–1.9)

## 2017-11-20 MED ORDER — SODIUM CHLORIDE 0.9 % IV BOLUS
1000.0000 mL | Freq: Once | INTRAVENOUS | Status: AC
  Administered 2017-11-20: 1000 mL via INTRAVENOUS

## 2017-11-20 MED ORDER — IOPAMIDOL (ISOVUE-300) INJECTION 61%
INTRAVENOUS | Status: AC
  Filled 2017-11-20: qty 100

## 2017-11-20 MED ORDER — FENTANYL CITRATE (PF) 100 MCG/2ML IJ SOLN
100.0000 ug | Freq: Once | INTRAMUSCULAR | Status: AC
  Administered 2017-11-20: 100 ug via INTRAVENOUS
  Filled 2017-11-20: qty 2

## 2017-11-20 MED ORDER — ONDANSETRON HCL 4 MG/2ML IJ SOLN
4.0000 mg | Freq: Once | INTRAMUSCULAR | Status: AC
  Administered 2017-11-20: 4 mg via INTRAVENOUS
  Filled 2017-11-20: qty 2

## 2017-11-20 NOTE — ED Provider Notes (Signed)
[facility] EMERGENCY DEPARTMENT Provider Note   CSN: [id number] Arrival date & time: 11/20/17  1805     History   Chief Complaint Chief Complaint  Patient presents with  . Abdominal Pain  . Emesis  . Diarrhea  . Headache    HPI Joshua Wall is a 32 y.o. male.  The history is provided by the patient and the spouse. A language interpreter was used [phone]).  Abdominal Pain   This is a new problem. The current episode started yesterday. The problem occurs constantly. The problem has been gradually worsening. The pain is located in the RLQ. The pain is moderate. Associated symptoms include nausea. Pertinent negatives include dysuria. The symptoms are aggravated by palpation. Nothing relieves the symptoms.  Headache   Associated symptoms include nausea.  For the past 24 hours been having increasing abdominal pain.  He also reports fevers.  He reports nausea, but no vomiting and no diarrhea.  He reports minimal cough.  He reports body aches and headache.  No travel.  No rash  Past Medical History:  Diagnosis Date  . Diabetes mellitus without complication [facility])     Patient Active Problem List   Diagnosis Date Noted  . Toenail fungus 07/02/2017  . Type 2 diabetes mellitus without complication, without long-term current use of insulin ([place]) 02/04/2017  . Candidal balanitis 02/04/2017    History reviewed. No pertinent surgical history.      Home Medications    Prior to Admission medications   Medication Sig Start Date End Date Taking? Authorizing Provider  glucose blood (FREESTYLE PRECISION NEO TEST) test strip Use as instructed 07/01/17   Katherine Roan, MD  Insulin Syringe-Needle U-100 31G X 15/64" 0.3 ML MISC Inject 10 Units as directed at bedtime. 03/05/17   Tawny Asal, MD  Lancets (FREESTYLE) lancets Use as instructed 07/01/17   Katherine Roan, MD  metFORMIN (GLUCOPHAGE) 1000 MG tablet Take 1 tablet (1,000 mg total) by mouth 2 (two)  times daily with a meal. 03/05/17   Tawny Asal, MD    Family History History reviewed. No pertinent family history.  Social History Social History   Tobacco Use  . Smoking status: Former Smoker    Types: Cigarettes  . Smokeless tobacco: Never Used  . Tobacco comment: 3 cigarettes per week.  Substance Use Topics  . Alcohol use: Yes    Comment: Sometimes.  . Drug use: No     Allergies   Patient has no known allergies.   Review of Systems Review of Systems  Constitutional: Positive for fatigue.  Respiratory: Positive for cough.   Gastrointestinal: Positive for abdominal pain and nausea.  Genitourinary: Negative for dysuria.  Skin: Negative for rash.  All other systems reviewed and are negative.    Physical Exam Updated Vital Signs BP (!) 124/112 (BP Location: Right Arm)   Pulse (!) 117   Temp (!) 100.5 F (38.1 C) (Oral)   Resp 16   Wt 97.5 kg (215 lb)   SpO2 98%   BMI 32.22 kg/m   Physical Exam  CONSTITUTIONAL: Well developed/well nourished HEAD: Normocephalic/atraumatic EYES: EOMI/PERRL ENMT: Mucous membranes moist NECK: supple no meningeal signs SPINE/BACK:entire spine nontender CV: S1/S2 noted, no murmurs/rubs/gallops noted LUNGS: Lungs are clear to auscultation bilaterally, no apparent distress ABDOMEN: soft, moderate right lower quadrant tenderness, no rebound or guarding, bowel sounds noted throughout abdomen GU:no cva tenderness, no testicular tenderness, no hernias noted.  Family at bedside per patient request NEURO: Pt is awake/alert/appropriate, moves  all extremitiesx4.  No facial droop.   EXTREMITIES: pulses normal/equal, full ROM SKIN: warm, color normal, no rash PSYCH: no abnormalities of mood noted, alert and oriented to situation  ED Treatments / Results  Labs (all labs ordered are listed, but only abnormal results are displayed) Labs Reviewed  COMPREHENSIVE METABOLIC PANEL - Abnormal; Notable for the following components:       Result Value   Sodium 133 (*)    Potassium 3.3 (*)    CO2 20 (*)    Glucose, Bld 127 (*)    Total Protein 8.5 (*)    All other components within normal limits  CBC WITH DIFFERENTIAL/PLATELET - Abnormal; Notable for the following components:   WBC 13.1 (*)    MCHC 36.4 (*)    Neutro Abs 10.6 (*)    All other components within normal limits  URINALYSIS, ROUTINE W REFLEX MICROSCOPIC - Abnormal; Notable for the following components:   Ketones, ur 5 (*)    Protein, ur 30 (*)    Squamous Epithelial / LPF 0-5 (*)    All other components within normal limits  CULTURE, BLOOD (ROUTINE X 2)  CULTURE, BLOOD (ROUTINE X 2)  I-STAT CG4 LACTIC ACID, ED    EKG None  Radiology Dg Chest 2 View  Result Date: 11/20/2017 CLINICAL DATA:  Fever and tachycardia EXAM: CHEST - 2 VIEW COMPARISON:  None. FINDINGS: The heart size and mediastinal contours are within normal limits. Both lungs are clear. The visualized skeletal structures are unremarkable. IMPRESSION: No active cardiopulmonary disease. Electronically Signed   By: Franchot Gallo M.D.   On: 11/20/2017 19:40    Procedures .Critical Care Performed by: Ripley Fraise, MD Authorized by: Ripley Fraise, MD   Critical care provider statement:    Critical care time (minutes):  56   Critical care start time:  11/21/2017 12:04 AM   Critical care end time:  11/21/2017 1:00 AM   Critical care time was exclusive of:  Separately billable procedures and treating other patients   Critical care was necessary to treat or prevent imminent or life-threatening deterioration of the following conditions:  Sepsis   Critical care was time spent personally by me on the following activities:  Evaluation of patient's response to treatment, examination of patient, re-evaluation of patient's condition, ordering and review of radiographic studies, ordering and review of laboratory studies, discussions with consultants, obtaining history from patient or surrogate, pulse  oximetry and development of treatment plan with patient or surrogate   I assumed direction of critical care for this patient from another provider in my specialty: no      Medications Ordered in ED Medications  iopamidol (ISOVUE-300) 61 % injection (has no administration in time range)  piperacillin-tazobactam (ZOSYN) IVPB 3.375 g (3.375 g Intravenous New Bag/Given 11/21/17 0119)  sodium chloride 0.9 % bolus 1,000 mL (1,000 mLs Intravenous New Bag/Given 11/21/17 0119)  fentaNYL (SUBLIMAZE) injection 50 mcg (has no administration in time range)  sodium chloride 0.9 % bolus 1,000 mL (0 mLs Intravenous Stopped 11/21/17 0109)  ondansetron (ZOFRAN) injection 4 mg (4 mg Intravenous Given 11/20/17 2345)  fentaNYL (SUBLIMAZE) injection 100 mcg (100 mcg Intravenous Given 11/20/17 2345)  iopamidol (ISOVUE-300) 61 % injection 100 mL (100 mLs Intravenous Contrast Given 11/21/17 0014)     Initial Impression / Assessment and Plan / ED Course  I have reviewed the triage vital signs and the nursing notes.  Pertinent labs & imaging results that were available during my care of the patient were  reviewed by me and considered in my medical decision making (see chart for details).     11:58 PM Plan to obtain CT imaging to evaluate for appendicitis or other acute abdominal emergency.  He denies any vomiting or diarrhea to me when using interpreter 12:59 AM Obtained phone call from radiology.  Patient is noted to have diverticulitis with microperforation.  IV fluids and IV antibiotics ordered.  Will consult medicine for admission.  This appears to be a contained perforation, will likely not need emergent surgery at this time  Patient feels improved at this time 1:21 AM Updated patient and his wife on plan with interpreter.  I discussed the case with internal medicine for admission. Final Clinical Impressions(s) / ED Diagnoses   Final diagnoses:  Diverticulitis    ED Discharge Orders    None         Ripley Fraise, MD 11/21/17 [phone]

## 2017-11-20 NOTE — ED Triage Notes (Signed)
Pt presents with lower abd pain with decreased urination, n/v/d x 2 days; currently has fever and tachycardia in triage

## 2017-11-21 ENCOUNTER — Emergency Department (HOSPITAL_COMMUNITY): Payer: Medicaid Other

## 2017-11-21 ENCOUNTER — Encounter (HOSPITAL_COMMUNITY): Payer: Self-pay | Admitting: Radiology

## 2017-11-21 ENCOUNTER — Other Ambulatory Visit: Payer: Self-pay

## 2017-11-21 DIAGNOSIS — B351 Tinea unguium: Secondary | ICD-10-CM | POA: Diagnosis not present

## 2017-11-21 DIAGNOSIS — R7881 Bacteremia: Secondary | ICD-10-CM | POA: Diagnosis not present

## 2017-11-21 DIAGNOSIS — E876 Hypokalemia: Secondary | ICD-10-CM | POA: Diagnosis not present

## 2017-11-21 DIAGNOSIS — Z87891 Personal history of nicotine dependence: Secondary | ICD-10-CM

## 2017-11-21 DIAGNOSIS — E119 Type 2 diabetes mellitus without complications: Secondary | ICD-10-CM | POA: Diagnosis not present

## 2017-11-21 DIAGNOSIS — R109 Unspecified abdominal pain: Secondary | ICD-10-CM | POA: Diagnosis not present

## 2017-11-21 DIAGNOSIS — Z23 Encounter for immunization: Secondary | ICD-10-CM | POA: Diagnosis not present

## 2017-11-21 DIAGNOSIS — F17210 Nicotine dependence, cigarettes, uncomplicated: Secondary | ICD-10-CM | POA: Diagnosis not present

## 2017-11-21 DIAGNOSIS — D72829 Elevated white blood cell count, unspecified: Secondary | ICD-10-CM | POA: Diagnosis not present

## 2017-11-21 DIAGNOSIS — K5720 Diverticulitis of large intestine with perforation and abscess without bleeding: Secondary | ICD-10-CM | POA: Diagnosis not present

## 2017-11-21 DIAGNOSIS — Z7984 Long term (current) use of oral hypoglycemic drugs: Secondary | ICD-10-CM | POA: Diagnosis not present

## 2017-11-21 DIAGNOSIS — Z8719 Personal history of other diseases of the digestive system: Secondary | ICD-10-CM | POA: Diagnosis present

## 2017-11-21 DIAGNOSIS — K5792 Diverticulitis of intestine, part unspecified, without perforation or abscess without bleeding: Secondary | ICD-10-CM | POA: Diagnosis present

## 2017-11-21 LAB — BASIC METABOLIC PANEL
Anion gap: 11 (ref 5–15)
BUN: 8 mg/dL (ref 6–20)
CALCIUM: 8.1 mg/dL — AB (ref 8.9–10.3)
CHLORIDE: 100 mmol/L — AB (ref 101–111)
CO2: 22 mmol/L (ref 22–32)
Creatinine, Ser: 0.78 mg/dL (ref 0.61–1.24)
Glucose, Bld: 143 mg/dL — ABNORMAL HIGH (ref 65–99)
Potassium: 3.4 mmol/L — ABNORMAL LOW (ref 3.5–5.1)
SODIUM: 133 mmol/L — AB (ref 135–145)

## 2017-11-21 LAB — CBC
HCT: 37.2 % — ABNORMAL LOW (ref 39.0–52.0)
Hemoglobin: 13.2 g/dL (ref 13.0–17.0)
MCH: 30.5 pg (ref 26.0–34.0)
MCHC: 35.5 g/dL (ref 30.0–36.0)
MCV: 85.9 fL (ref 78.0–100.0)
PLATELETS: 204 10*3/uL (ref 150–400)
RBC: 4.33 MIL/uL (ref 4.22–5.81)
RDW: 13.1 % (ref 11.5–15.5)
WBC: 12.1 10*3/uL — AB (ref 4.0–10.5)

## 2017-11-21 LAB — HEMOGLOBIN A1C
HEMOGLOBIN A1C: 5.8 % — AB (ref 4.8–5.6)
MEAN PLASMA GLUCOSE: 119.76 mg/dL

## 2017-11-21 LAB — GLUCOSE, CAPILLARY
Glucose-Capillary: 143 mg/dL — ABNORMAL HIGH (ref 65–99)
Glucose-Capillary: 125 mg/dL — ABNORMAL HIGH (ref 65–99)
Glucose-Capillary: 143 mg/dL — ABNORMAL HIGH (ref 65–99)
Glucose-Capillary: 101 mg/dL — ABNORMAL HIGH (ref 65–99)

## 2017-11-21 LAB — HIV ANTIBODY (ROUTINE TESTING W REFLEX): HIV SCREEN 4TH GENERATION: NONREACTIVE

## 2017-11-21 MED ORDER — POTASSIUM CHLORIDE 10 MEQ/100ML IV SOLN
10.0000 meq | INTRAVENOUS | Status: AC
  Administered 2017-11-21 (×2): 10 meq via INTRAVENOUS
  Filled 2017-11-21 (×2): qty 100

## 2017-11-21 MED ORDER — LACTATED RINGERS IV SOLN
INTRAVENOUS | Status: DC
  Administered 2017-11-21 – 2017-11-22 (×4): via INTRAVENOUS

## 2017-11-21 MED ORDER — ACETAMINOPHEN 650 MG RE SUPP: 650.0000 mg | Freq: Four times a day (QID) | RECTAL | Status: DC | PRN

## 2017-11-21 MED ORDER — SODIUM CHLORIDE 0.9 % IV BOLUS
1000.0000 mL | Freq: Once | INTRAVENOUS | Status: AC
  Administered 2017-11-21: 1000 mL via INTRAVENOUS

## 2017-11-21 MED ORDER — ACETAMINOPHEN 325 MG PO TABS
650.0000 mg | ORAL_TABLET | Freq: Four times a day (QID) | ORAL | Status: DC | PRN
  Administered 2017-11-21 – 2017-11-22 (×3): 650 mg via ORAL
  Filled 2017-11-21 (×3): qty 2

## 2017-11-21 MED ORDER — PIPERACILLIN-TAZOBACTAM 3.375 G IVPB 30 MIN
3.3750 g | Freq: Once | INTRAVENOUS | Status: AC
  Administered 2017-11-21: 3.375 g via INTRAVENOUS
  Filled 2017-11-21: qty 50

## 2017-11-21 MED ORDER — FENTANYL CITRATE (PF) 100 MCG/2ML IJ SOLN
50.0000 ug | Freq: Once | INTRAMUSCULAR | Status: AC
  Administered 2017-11-21: 50 ug via INTRAVENOUS
  Filled 2017-11-21: qty 2

## 2017-11-21 MED ORDER — PNEUMOCOCCAL VAC POLYVALENT 25 MCG/0.5ML IJ INJ
0.5000 mL | INJECTION | INTRAMUSCULAR | Status: AC
  Administered 2017-11-22: 0.5 mL via INTRAMUSCULAR
  Filled 2017-11-21: qty 0.5

## 2017-11-21 MED ORDER — ONDANSETRON HCL 4 MG/2ML IJ SOLN
4.0000 mg | Freq: Four times a day (QID) | INTRAMUSCULAR | Status: DC | PRN
  Administered 2017-11-21 – 2017-11-23 (×4): 4 mg via INTRAVENOUS
  Filled 2017-11-21 (×4): qty 2

## 2017-11-21 MED ORDER — HEPARIN SODIUM (PORCINE) 5000 UNIT/ML IJ SOLN
5000.0000 [IU] | Freq: Three times a day (TID) | INTRAMUSCULAR | Status: DC
  Administered 2017-11-21 – 2017-11-24 (×8): 5000 [IU] via SUBCUTANEOUS
  Filled 2017-11-21 (×8): qty 1

## 2017-11-21 MED ORDER — CIPROFLOXACIN IN D5W 400 MG/200ML IV SOLN
400.0000 mg | Freq: Two times a day (BID) | INTRAVENOUS | Status: DC
  Administered 2017-11-21 – 2017-11-24 (×7): 400 mg via INTRAVENOUS
  Filled 2017-11-21 (×8): qty 200

## 2017-11-21 MED ORDER — INSULIN ASPART 100 UNIT/ML ~~LOC~~ SOLN
0.0000 [IU] | Freq: Three times a day (TID) | SUBCUTANEOUS | Status: DC
  Administered 2017-11-21 – 2017-11-24 (×8): 1 [IU] via SUBCUTANEOUS

## 2017-11-21 MED ORDER — METRONIDAZOLE IN NACL 5-0.79 MG/ML-% IV SOLN
500.0000 mg | Freq: Three times a day (TID) | INTRAVENOUS | Status: DC
  Administered 2017-11-21 – 2017-11-24 (×11): 500 mg via INTRAVENOUS
  Filled 2017-11-21 (×13): qty 100

## 2017-11-21 MED ORDER — KETOROLAC TROMETHAMINE 15 MG/ML IJ SOLN
15.0000 mg | Freq: Once | INTRAMUSCULAR | Status: AC
  Administered 2017-11-21: 15 mg via INTRAVENOUS
  Filled 2017-11-21: qty 1

## 2017-11-21 MED ORDER — IOPAMIDOL (ISOVUE-300) INJECTION 61%
100.0000 mL | Freq: Once | INTRAVENOUS | Status: AC | PRN
  Administered 2017-11-21: 100 mL via INTRAVENOUS

## 2017-11-21 MED ORDER — MORPHINE SULFATE (PF) 4 MG/ML IV SOLN
2.0000 mg | INTRAVENOUS | Status: DC | PRN
  Administered 2017-11-21 (×3): 4 mg via INTRAVENOUS
  Administered 2017-11-22: 2 mg via INTRAVENOUS
  Administered 2017-11-22 (×2): 4 mg via INTRAVENOUS
  Filled 2017-11-21 (×7): qty 1

## 2017-11-21 MED ORDER — SODIUM CHLORIDE 0.9 % IV SOLN
2.0000 g | INTRAVENOUS | Status: DC
  Administered 2017-11-21: 2 g via INTRAVENOUS
  Filled 2017-11-21: qty 20

## 2017-11-21 MED ORDER — ONDANSETRON HCL 4 MG PO TABS: 4.0000 mg | ORAL_TABLET | Freq: Four times a day (QID) | ORAL | Status: DC | PRN

## 2017-11-21 NOTE — ED Notes (Signed)
Patient transported to CT 

## 2017-11-21 NOTE — H&P (Signed)
Date: 11/21/2017               Patient Name:  Joshua Wall MRN: [id number]  DOB: September 17, 1985 Age / Sex: 32 y.o., male   PCP: Katherine Roan, MD         Medical Service: Internal Medicine Teaching Service         Attending Physician: Dr. Aldine Contes, MD    First Contact: Dr. Johny Chess Pager: [phone]  Second Contact: Dr. Heber  Pager: [phone]       After Hours (After 5p/  First Contact Pager: [phone]  weekends / holidays): Second Contact Pager: [phone]   Chief Complaint: abdominal pain  History of Present Illness:  32 yo male with PMH of T2DM presenting with two days of abdominal pain with nausea and vomiting. Patient states he was in his normal state of health until 2 days prior to admission when he developed sharp/stinging lower abdominal worse in the RLQ than the LLQ. He has associated nausea, vomiting, fever, and headache. He has had decreased PO intake the past 2 days. He denies diarrhea, dysuria, hematuria, chest pain, or SOB. He has never had this pain before. The pain became unbearable and he decided to come to the ED to be evaluated.   He does endorse experiencing hard stools and straining to have a bowel movement. He states he typically has a bowel movement daily. Some days his stools are soft and other days they are hard and require straining. He denies history of previous abdominal surgeries.   ED Course: Vitals: BP 124/77, pulse (!) 122, temperature (!) 102.6 F (39.2C), resp. rate 16, SpO2 97 % on RA. Labs: Na 133, K 3.3, glucose 127; LFTs WNL; WBC 13., LA 1.68 Meds: zosyn, fentanyl, zofran, 2  liter NS Imaging: CT abdomen pelvis demonstrated acute diverticulitis, with soft tissue inflammation at the mid sigmoid colon, inflamed diverticula and minimal free air tracking into the adjacent mesentery, reflecting microperforation  Meds:  Current Meds  Medication Sig  . dimenhyDRINATE (DRAMAMINE) 50 MG tablet Take 50 mg by mouth every 8 (eight) hours as  needed for nausea.  . diphenhydramine-acetaminophen (TYLENOL PM) 25-500 MG TABS tablet Take 1 tablet by mouth daily as needed (for pain).  . metFORMIN (GLUCOPHAGE) 1000 MG tablet Take 1 tablet (1,000 mg total) by mouth 2 (two) times daily with a meal.     Allergies: Allergies as of 11/20/2017  . (No Known Allergies)   Past Medical History:  Diagnosis Date  . Diabetes mellitus without complication ([place])     Family History:  History reviewed. No pertinent family history.  Social History:  Social History   Tobacco Use  . Smoking status: Former Smoker    Types: Cigarettes  . Smokeless tobacco: Never Used  . Tobacco comment: 3 cigarettes per week.  Substance Use Topics  . Alcohol use: Yes    Comment: Sometimes.  . Drug use: No    Review of Systems: A complete ROS was negative except as per HPI.   Physical Exam: Blood pressure 123/74, pulse (!) 102, temperature (!) 100.5 F (38.1 C), temperature source Oral, resp. rate 16, weight 215 lb (97.5 kg), SpO2 93 %. Physical Exam  Constitutional: He is oriented to person, place, and time. He appears well-developed and well-nourished.  HENT:  Head: Normocephalic and atraumatic.  Neck: Normal range of motion. Neck supple.  Cardiovascular: Regular rhythm, normal heart sounds and intact distal pulses. Tachycardia present.  Pulmonary/Chest: Effort normal and breath sounds normal.  No respiratory distress. He has no wheezes.  Abdominal: Soft. Bowel sounds are normal. He exhibits no distension. There is tenderness (RLQ>LLQ). There is no rebound and no guarding.  Musculoskeletal: Normal range of motion. He exhibits no edema.  Neurological: He is alert and oriented to person, place, and time. No cranial nerve deficit.  Skin: Skin is warm and dry.    EKG: none to review  CXR: personally reviewed my interpretation is negative for focal opacity or pleural effusion. Negative for active cardiopulmonary disease  Assessment & Plan by  Problem: Active Problems:   Acute diverticulitis  Acute Diverticulitis Patient presenting febrile, tachycardic with 2 day history of RLQ abdominal pain and nausea/vomiting. CT scan of the abdomen demonstrated acute diverticulitis of the mid sigmoid colon with microperforation. Perforation appears contained, no need for emergent surgery at this time. Abdominal exam soft, no rigidity, TTP in RLQ>LLQ, no rebound tenderness, bowel sounds normal. Patient received 1x dose of Zosyn in ED.  -Will discontinue Zosyn -Start Ceftriaxone and flagyl   -Diet: NPO -IVF: LR 125 cc/hr  -Pain management: morphine 2-4 mg q4 PRN -Zofran PRN for nausea -Blood cultures obtained and pending   Type 2 DM Most recent A1C 5.8. Home regimen includes metformin 1000 mg BID. -CBG monitoring  -SSI sensitive -Hemoglobin A1C pending   Hypokalemia Potassium 3.3 on admission.  -Repleting -Repeat BMP in AM   Dispo: Admit patient to Inpatient with expected length of stay greater than 2 midnights.  Signed: Melanee Spry, MD 11/21/2017, 1:47 AM  Pager: [phone]

## 2017-11-21 NOTE — Progress Notes (Signed)
   Subjective: No acute events overnight following admission.  Reports his abdominal pain is still present but improved since admission.  No bowel movements and no appetite at this point.  Objective:  Vital signs in last 24 hours: Vitals:   11/21/17 0600 11/21/17 0802 11/21/17 0836 11/21/17 1257  BP: (!) 102/54 129/69 122/78 123/75  Pulse: 84 83 88 88  Resp:  20 (!) 22 18  Temp:  99.5 F (37.5 C) 99.4 F (37.4 C) 99.9 F (37.7 C)  TempSrc:  Oral Oral Oral  SpO2: 95% 94% 98% 96%  Weight:       General: Resting in bed, mildly uncomfortably but no acute distress CV: RRR, no murmur appreciated  Resp: Clear anterior breath sounds bilaterally, normal work of breathing, no distress  Abd: Soft, +BS, obese, tenderness to palpation of bilateral lower abdominal quadrants, worse on R with no guarding or rebound tenderness  Extr: No LE edema  Neuro: Alert and oriented x3  Skin: Warm, dry      Assessment/Plan:  Acute Diverticulitis Presented with sudden onset of progressive lower abdominal pain with nausea, vomiting, fevers.  CT showed acute diverticulitis around the mid sigmoid: With evidence of micro perforation, no abscess.  He was started on IV antibiotics with IV fluids and pain control.  Currently n.p.o. for bowel rest, will advance diet as tolerated.  If he fails to improve as expected or worsens, will consider repeat scanning or surgical input. Will transition to equivalent oral abx as status improves. --Monitor vital signs, fever curve --NPO, advance to clears as tolerated --Cont IV Metronidazole 500 mg q8hr --Transition to IV Cipro 400 mg q12hr --Cont IVF- LR 125 cc/hr --Pain Control: Morphine 2-4 mg q4hr prn  --Anti-emetic: Zofran 4 mg q6hr prn    Dispo: Anticipated discharge in approximately 2-3 day(s).   Tawny Asal, MD 11/21/2017, 1:34 PM Pager: [phone]

## 2017-11-21 NOTE — Care Management Note (Signed)
Case Management Note  Patient Details  Name: Joshua Wall MRN: [id number] Date of Birth: 12/21/1985  Subjective/Objective:                    Action/Plan:  Will continue to follow for discharge needs. Will need [place] letter at discharge to assist with prescriptions. Expected Discharge Date:                  Expected Discharge Plan:  Home/Self Care  In-House Referral:  Financial Counselor  Discharge planning Services  [place] [facility], Medication Assistance  Post Acute Care Choice:  NA Choice offered to:     DME Arranged:  N/A DME Agency:  NA  HH Arranged:  NA HH Agency:  NA  Status of Service:  In process, will continue to follow  If discussed at Long Length of Stay Meetings, dates discussed:    Additional Comments:  Marilu Favre, RN 11/21/2017, 10:34 AM

## 2017-11-21 NOTE — Progress Notes (Signed)
MD made aware of the patient increased temp and chest pain. VSS. No new order noted, will continue to monitor.

## 2017-11-22 DIAGNOSIS — R350 Frequency of micturition: Secondary | ICD-10-CM

## 2017-11-22 LAB — CBC
HEMATOCRIT: 35 % — AB (ref 39.0–52.0)
HEMOGLOBIN: 12.1 g/dL — AB (ref 13.0–17.0)
MCH: 29.7 pg (ref 26.0–34.0)
MCHC: 34.6 g/dL (ref 30.0–36.0)
MCV: 85.8 fL (ref 78.0–100.0)
Platelets: 189 10*3/uL (ref 150–400)
RBC: 4.08 MIL/uL — ABNORMAL LOW (ref 4.22–5.81)
RDW: 12.8 % (ref 11.5–15.5)
WBC: 9.1 10*3/uL (ref 4.0–10.5)

## 2017-11-22 LAB — GLUCOSE, CAPILLARY
GLUCOSE-CAPILLARY: 121 mg/dL — AB (ref 65–99)
GLUCOSE-CAPILLARY: 139 mg/dL — AB (ref 65–99)
Glucose-Capillary: 126 mg/dL — ABNORMAL HIGH (ref 65–99)
Glucose-Capillary: 94 mg/dL (ref 65–99)

## 2017-11-22 LAB — BASIC METABOLIC PANEL
ANION GAP: 11 (ref 5–15)
BUN: 7 mg/dL (ref 6–20)
CHLORIDE: 100 mmol/L — AB (ref 101–111)
CO2: 24 mmol/L (ref 22–32)
Calcium: 8.4 mg/dL — ABNORMAL LOW (ref 8.9–10.3)
Creatinine, Ser: 0.84 mg/dL (ref 0.61–1.24)
GFR calc Af Amer: >60 mL/min (ref >60)
GFR calc non Af Amer: >60 mL/min (ref >60)
GLUCOSE: 129 mg/dL — AB (ref 65–99)
POTASSIUM: 3.3 mmol/L — AB (ref 3.5–5.1)
Sodium: 135 mmol/L (ref 135–145)

## 2017-11-22 LAB — PHOSPHORUS: PHOSPHORUS: 2.8 mg/dL (ref 2.5–4.6)

## 2017-11-22 LAB — MAGNESIUM: Magnesium: 1.8 mg/dL (ref 1.7–2.4)

## 2017-11-22 MED ORDER — POTASSIUM CHLORIDE 10 MEQ/100ML IV SOLN
10.0000 meq | INTRAVENOUS | Status: AC
  Administered 2017-11-22 (×3): 10 meq via INTRAVENOUS
  Filled 2017-11-22 (×3): qty 100

## 2017-11-22 NOTE — Progress Notes (Signed)
   Subjective: No acute events overnight, states abdominal pain is improved compared to admission, states it continues to fluctuate. Does feel more of an appetite and would like to attempt liquids today. Has had some diarrhea and also inquires about urinating frequently, feeling of bladder feeling feel and discomfort--no issues voiding and it improves afterward. Questions answered, urination/bladder likely related to fluid administration.   Objective:  Vital signs in last 24 hours: Vitals:   11/21/17 1551 11/21/17 1925 11/21/17 2204 11/22/17 0451  BP: 120/82  127/69 99/63  Pulse: 95  85 89  Resp: 20  18 18   Temp: (!) 103.1 F (39.5 C) 99.7 F (37.6 C) 99.2 F (37.3 C) 98.6 F (37 C)  TempSrc: Oral Oral Oral Oral  SpO2: 98%  98% 96%  Weight:       General: Resting in bed comfortably, improved from prior but no acute distress CV: RRR, no murmur appreciated  Resp: Normal work of breathing, no distress  Abd: Soft, +BS, obese, tenderness to palpation of bilateral lower abdominal quadrants, worse on R with no guarding or rebound tenderness. Stable to somewhat improved exam  Extr: No LE edema  GU: No abnormal scrotal swelling or tenderness  Neuro: Alert and oriented x3  Skin: Warm, dry      Assessment/Plan:  Acute Diverticulitis Presented with sudden onset of progressive lower abdominal pain with nausea, vomiting, fevers.  CT showed acute diverticulitis around the mid sigmoid: With evidence of micro perforation, no abscess.  He was started on IV antibiotics with IV fluids and pain control. His pain has improved this morning, will advance diet to clears and monitor response. Will transition to equivalent oral abx as status improves. --Monitor vital signs, fever curve --Clear Liquid diet, advance as tolerated  --Cont IV Metronidazole 500 mg q8hr and IV Cipro 400 mg q12hr --DC fluids with oral intake  --Pain Control: Morphine 2-4 mg q4hr prn, to oral as improves   --Anti-emetic: Zofran  4 mg q6hr prn    Dispo: Anticipated discharge in approximately 1-2 day(s).   Tawny Asal, MD 11/22/2017, 10:29 AM Pager: [phone]

## 2017-11-23 DIAGNOSIS — R7881 Bacteremia: Secondary | ICD-10-CM | POA: Diagnosis present

## 2017-11-23 DIAGNOSIS — K5792 Diverticulitis of intestine, part unspecified, without perforation or abscess without bleeding: Secondary | ICD-10-CM

## 2017-11-23 LAB — BLOOD CULTURE ID PANEL (REFLEXED)
ACINETOBACTER BAUMANNII: NOT DETECTED
CANDIDA ALBICANS: NOT DETECTED
CANDIDA GLABRATA: NOT DETECTED
CANDIDA KRUSEI: NOT DETECTED
CANDIDA TROPICALIS: NOT DETECTED
Candida parapsilosis: NOT DETECTED
ENTEROBACTER CLOACAE COMPLEX: NOT DETECTED
ESCHERICHIA COLI: NOT DETECTED
Enterobacteriaceae species: NOT DETECTED
Enterococcus species: NOT DETECTED
Haemophilus influenzae: NOT DETECTED
KLEBSIELLA PNEUMONIAE: NOT DETECTED
Klebsiella oxytoca: NOT DETECTED
Listeria monocytogenes: NOT DETECTED
Neisseria meningitidis: NOT DETECTED
PROTEUS SPECIES: NOT DETECTED
Pseudomonas aeruginosa: NOT DETECTED
STAPHYLOCOCCUS SPECIES: NOT DETECTED
STREPTOCOCCUS PNEUMONIAE: NOT DETECTED
Serratia marcescens: NOT DETECTED
Staphylococcus aureus (BCID): NOT DETECTED
Streptococcus agalactiae: NOT DETECTED
Streptococcus pyogenes: NOT DETECTED
Streptococcus species: NOT DETECTED

## 2017-11-23 LAB — BASIC METABOLIC PANEL
Anion gap: 11 (ref 5–15)
BUN: 6 mg/dL (ref 6–20)
CHLORIDE: 102 mmol/L (ref 101–111)
CO2: 23 mmol/L (ref 22–32)
Calcium: 8.7 mg/dL — ABNORMAL LOW (ref 8.9–10.3)
Creatinine, Ser: 0.76 mg/dL (ref 0.61–1.24)
GFR calc Af Amer: >60 mL/min (ref >60)
GFR calc non Af Amer: >60 mL/min (ref >60)
GLUCOSE: 136 mg/dL — AB (ref 65–99)
POTASSIUM: 3.3 mmol/L — AB (ref 3.5–5.1)
Sodium: 136 mmol/L (ref 135–145)

## 2017-11-23 LAB — GLUCOSE, CAPILLARY
GLUCOSE-CAPILLARY: 111 mg/dL — AB (ref 65–99)
GLUCOSE-CAPILLARY: 101 mg/dL — AB (ref 65–99)
Glucose-Capillary: 121 mg/dL — ABNORMAL HIGH (ref 65–99)
Glucose-Capillary: 135 mg/dL — ABNORMAL HIGH (ref 65–99)

## 2017-11-23 LAB — CBC
HEMATOCRIT: 36.8 % — AB (ref 39.0–52.0)
HEMOGLOBIN: 12.8 g/dL — AB (ref 13.0–17.0)
MCH: 29.6 pg (ref 26.0–34.0)
MCHC: 34.8 g/dL (ref 30.0–36.0)
MCV: 85.2 fL (ref 78.0–100.0)
Platelets: 247 10*3/uL (ref 150–400)
RBC: 4.32 MIL/uL (ref 4.22–5.81)
RDW: 12.6 % (ref 11.5–15.5)
WBC: 6.9 10*3/uL (ref 4.0–10.5)

## 2017-11-23 MED ORDER — OXYCODONE HCL 5 MG PO TABS: 5.0000 mg | ORAL_TABLET | ORAL | Status: DC | PRN

## 2017-11-23 MED ORDER — OXYCODONE HCL 5 MG PO TABS
5.0000 mg | ORAL_TABLET | Freq: Four times a day (QID) | ORAL | Status: DC | PRN
  Administered 2017-11-23 – 2017-11-24 (×3): 5 mg via ORAL
  Filled 2017-11-23 (×3): qty 1

## 2017-11-23 MED ORDER — IBUPROFEN 600 MG PO TABS
600.0000 mg | ORAL_TABLET | Freq: Four times a day (QID) | ORAL | Status: DC | PRN
  Administered 2017-11-23: 600 mg via ORAL
  Filled 2017-11-23: qty 1

## 2017-11-23 NOTE — Progress Notes (Signed)
PHARMACY - PHYSICIAN COMMUNICATION CRITICAL VALUE ALERT - BLOOD CULTURE IDENTIFICATION (BCID)  Joshua Wall is an 32 y.o. male who presented to [facility] on 11/20/2017 with a chief complaint of  abdominal pain  Assessment: pt clinically improving of cipro and flagyl  Name of physician (or Provider) Contacted: Johny Chess  Current antibiotics: Cipro/Flagy  Changes to prescribed antibiotics recommended:  Patient is on recommended antibiotics - No changes needed  Results for orders placed or performed during the hospital encounter of 11/20/17  Blood Culture ID Panel (Reflexed) (Collected: 11/20/2017  6:47 PM)  Result Value Ref Range   Enterococcus species NOT DETECTED NOT DETECTED   Listeria monocytogenes NOT DETECTED NOT DETECTED   Staphylococcus species NOT DETECTED NOT DETECTED   Staphylococcus aureus NOT DETECTED NOT DETECTED   Streptococcus species NOT DETECTED NOT DETECTED   Streptococcus agalactiae NOT DETECTED NOT DETECTED   Streptococcus pneumoniae NOT DETECTED NOT DETECTED   Streptococcus pyogenes NOT DETECTED NOT DETECTED   Acinetobacter baumannii NOT DETECTED NOT DETECTED   Enterobacteriaceae species NOT DETECTED NOT DETECTED   Enterobacter cloacae complex NOT DETECTED NOT DETECTED   Escherichia coli NOT DETECTED NOT DETECTED   Klebsiella oxytoca NOT DETECTED NOT DETECTED   Klebsiella pneumoniae NOT DETECTED NOT DETECTED   Proteus species NOT DETECTED NOT DETECTED   Serratia marcescens NOT DETECTED NOT DETECTED   Haemophilus influenzae NOT DETECTED NOT DETECTED   Neisseria meningitidis NOT DETECTED NOT DETECTED   Pseudomonas aeruginosa NOT DETECTED NOT DETECTED   Candida albicans NOT DETECTED NOT DETECTED   Candida glabrata NOT DETECTED NOT DETECTED   Candida krusei NOT DETECTED NOT DETECTED   Candida parapsilosis NOT DETECTED NOT DETECTED   Candida tropicalis NOT DETECTED NOT DETECTED    [name] 11/23/2017  2:29 PM

## 2017-11-23 NOTE — Progress Notes (Signed)
   Subjective: Patient was evaluated on rounds this morning. He reports improvement in his abdominal pain, however is still requiring pain medications. He states he has been able to tolerate a clear liquid diet. He denies nausea/vomiting or fever/chills.  Objective:  Vital signs in last 24 hours: Vitals:   11/22/17 0451 11/22/17 1353 11/22/17 2121 11/23/17 0533  BP: 99/63 125/82 101/87 128/80  Pulse: 89 85 82 75  Resp: 18 18 18 18   Temp: 98.6 F (37 C) 98.6 F (37 C) 98.7 F (37.1 C) 98 F (36.7 C)  TempSrc: Oral Oral Oral Oral  SpO2: 96% 97% 99% 99%  Weight:       Physical Exam  Constitutional: He is well-developed, well-nourished, and in no distress.  Cardiovascular: Normal rate, regular rhythm and normal heart sounds. Exam reveals no gallop and no friction rub.  No murmur heard. Pulmonary/Chest: Effort normal and breath sounds normal. No respiratory distress. He has no wheezes. He has no rales.  Abdominal: Soft. Bowel sounds are normal. He exhibits no distension and no mass. There is tenderness. There is no rebound and no guarding.  Skin: No erythema.     Assessment/Plan:  Active Problems:   Acute diverticulitis   Diverticulitis  Acute diverticulitis Patient shows continued improvement. Leukocytosis has resolved and patient has been afebrile for the past 48 hours.  Exam with mild tenderness to palpation and normal bowel sounds present. He was able to tolerate a liquid diet yesterday. Will advance this today to a regular diet. Will continue with IV antibiotics until ready for discharge. Will change pain meds from IV to oral today. Will continue with Zofran. Will continue to monitor for continued improvement. -regular diet -Tylenol,ibuprofen or oxycodone for pain -Zofran -IV Cipro and Flagyl  Type 2 diabetes Mrs. Recent hemoglobin A1c this admission was 5.8. Patient is currently on metformin.  uring admission patient has been placed on sliding scale insulin and CBGs have  been well controlled.  Dispo: Anticipated discharge 0-1 days.   Kalman Shan [place], DO 11/23/2017, 12:16 PM Pager: [phone]

## 2017-11-24 ENCOUNTER — Encounter: Payer: Self-pay | Admitting: Internal Medicine

## 2017-11-24 DIAGNOSIS — R799 Abnormal finding of blood chemistry, unspecified: Secondary | ICD-10-CM

## 2017-11-24 LAB — CBC
HCT: 37.7 % — ABNORMAL LOW (ref 39.0–52.0)
Hemoglobin: 13.1 g/dL (ref 13.0–17.0)
MCH: 29.8 pg (ref 26.0–34.0)
MCHC: 34.7 g/dL (ref 30.0–36.0)
MCV: 85.7 fL (ref 78.0–100.0)
Platelets: 308 10*3/uL (ref 150–400)
RBC: 4.4 MIL/uL (ref 4.22–5.81)
RDW: 12.8 % (ref 11.5–15.5)
WBC: 6.2 10*3/uL (ref 4.0–10.5)

## 2017-11-24 LAB — BASIC METABOLIC PANEL
Anion gap: 12 (ref 5–15)
BUN: 9 mg/dL (ref 6–20)
CO2: 23 mmol/L (ref 22–32)
Calcium: 9 mg/dL (ref 8.9–10.3)
Chloride: 102 mmol/L (ref 101–111)
Creatinine, Ser: 0.79 mg/dL (ref 0.61–1.24)
GFR calc Af Amer: >60 mL/min (ref >60)
GFR calc non Af Amer: >60 mL/min (ref >60)
Glucose, Bld: 130 mg/dL — ABNORMAL HIGH (ref 65–99)
Potassium: 3.3 mmol/L — ABNORMAL LOW (ref 3.5–5.1)
Sodium: 137 mmol/L (ref 135–145)

## 2017-11-24 LAB — GLUCOSE, CAPILLARY
GLUCOSE-CAPILLARY: 139 mg/dL — AB (ref 65–99)
Glucose-Capillary: 134 mg/dL — ABNORMAL HIGH (ref 65–99)

## 2017-11-24 MED ORDER — METRONIDAZOLE 500 MG PO TABS: 500.0000 mg | ORAL_TABLET | Freq: Three times a day (TID) | ORAL | 0 refills | Status: DC

## 2017-11-24 MED ORDER — POLYETHYLENE GLYCOL 3350 17 G PO PACK: 17.0000 g | PACK | Freq: Every day | ORAL | 0 refills | Status: DC

## 2017-11-24 MED ORDER — CIPROFLOXACIN HCL 500 MG PO TABS: 500.0000 mg | ORAL_TABLET | Freq: Two times a day (BID) | ORAL | 0 refills | Status: DC

## 2017-11-24 NOTE — Progress Notes (Signed)
   Subjective: No acute events overnight, he reports continued improvement of abdominal pain which fluctuates, bowel movements have improved and more normal. Interpreter used for interview and updates. Wife updated later in the morning.   Objective:  Vital signs in last 24 hours: Vitals:   11/23/17 0533 11/23/17 1625 11/23/17 2220 11/24/17 0359  BP: 128/80 131/81 123/83 (!) 130/92  Pulse: 75 85 63 70  Resp: 18   15  Temp: 98 F (36.7 C) 98.4 F (36.9 C) 97.8 F (36.6 C) 99.8 F (37.7 C)  TempSrc: Oral Oral Oral Oral  SpO2: 99% 98% 100% 96%  Weight:       General: Resting in bed comfortably, no distress CV: RRR, no murmur appreciated  Resp: Normal work of breathing, no distress  Abd: Soft, +BS, obese, minimal residual tenderness to palpation of lower quadrants Extr: No LE edema  Neuro: Alert and oriented x3  Skin: Warm, dry      Assessment/Plan:  Acute Diverticulitis Presented with sudden onset of progressive lower abdominal pain with nausea, vomiting, fevers.  CT showed acute diverticulitis around the mid sigmoid: He has improved with IV abx, pain control. Has been afebrile, leukocytosis has resolved and he is now tolerating a regular diet. One out of two blood cultures resulted positive with GNRs, no species- he is on appropriate abx and rpt cultures drawn. Likely stable for discharge today on equivalent oral abx for a total of 7 day course.  --Cont regular diet  --Transition to oral abx for discharge  --oxycodone 5 q6hr prn  --Zofran 4 mg q6hr prn    Dispo: Anticipated discharge in approximately 0-1 day(s).   Tawny Asal, MD 11/24/2017, 8:39 AM Pager: [phone]

## 2017-11-25 ENCOUNTER — Ambulatory Visit (INDEPENDENT_AMBULATORY_CARE_PROVIDER_SITE_OTHER): Payer: Self-pay | Admitting: Internal Medicine

## 2017-11-25 ENCOUNTER — Encounter: Payer: Self-pay | Admitting: Internal Medicine

## 2017-11-25 VITALS — BP 135/82 | HR 89 | Temp 98.1°F | Wt 247.6 lb

## 2017-11-25 DIAGNOSIS — E119 Type 2 diabetes mellitus without complications: Secondary | ICD-10-CM

## 2017-11-25 DIAGNOSIS — K5792 Diverticulitis of intestine, part unspecified, without perforation or abscess without bleeding: Secondary | ICD-10-CM

## 2017-11-25 DIAGNOSIS — K5780 Diverticulitis of intestine, part unspecified, with perforation and abscess without bleeding: Secondary | ICD-10-CM

## 2017-11-25 DIAGNOSIS — Z87891 Personal history of nicotine dependence: Secondary | ICD-10-CM

## 2017-11-25 DIAGNOSIS — Z7984 Long term (current) use of oral hypoglycemic drugs: Secondary | ICD-10-CM

## 2017-11-25 LAB — CULTURE, BLOOD (ROUTINE X 2)
Culture: NO GROWTH
SPECIAL REQUESTS: ADEQUATE
SPECIAL REQUESTS: ADEQUATE

## 2017-11-25 MED ORDER — LACTULOSE 20 GM/30ML PO SOLN: 30.0000 mL | ORAL | 0 refills | Status: DC | PRN

## 2017-11-25 MED ORDER — TRAMADOL HCL 50 MG PO TABS: 50.0000 mg | ORAL_TABLET | Freq: Two times a day (BID) | ORAL | 0 refills | Status: DC | PRN

## 2017-11-25 MED ORDER — CIPROFLOXACIN HCL 500 MG PO TABS: 500.0000 mg | ORAL_TABLET | Freq: Two times a day (BID) | ORAL | 0 refills | Status: DC

## 2017-11-25 MED ORDER — METRONIDAZOLE 500 MG PO TABS: 500.0000 mg | ORAL_TABLET | Freq: Three times a day (TID) | ORAL | 0 refills | Status: DC

## 2017-11-25 NOTE — Progress Notes (Signed)
CC: follow up of T2DM, Diverticulitis  HPI:  [name] is a 32 y.o. male with PMH below, he was just released from the hospital, he was treated for diverticulitis.  He reports doing overall well, he is still having abdominal pain but it has improved.  He has not noticed any fevers or chills, he had a bad episode of pain last night.    Please see A&P for status of the patient's chronic medical conditions  Past Medical History:  Diagnosis Date  . Acute diverticulitis 11/2017  . Diabetes mellitus without complication ([place])    Review of Systems:  ROS: Pulmonary: pt denies increased work of breathing, shortness of breath,  Cardiac: pt denies palpitations, chest pain,  Abdominal:continue abd pain, no vomiting   Physical Exam:  Vitals:   11/25/17 1421  BP: 135/82  Pulse: 89  Temp: 98.1 F (36.7 C)  TempSrc: Oral  SpO2: 100%  Weight: 247 lb 9.6 oz (112.3 kg)   Physical Exam  Constitutional: He is oriented to person, place, and time. He appears well-developed and well-nourished.  Eyes: Right eye exhibits no discharge. Left eye exhibits no discharge. No scleral icterus.  Cardiovascular: Normal rate, regular rhythm, normal heart sounds and intact distal pulses. Exam reveals no gallop and no friction rub.  No murmur heard. Pulmonary/Chest: Effort normal and breath sounds normal. No respiratory distress. He has no wheezes. He has no rales.  Abdominal: Soft. Bowel sounds are normal. He exhibits no distension and no mass. There is tenderness (LLQ). There is no guarding.  Neurological: He is alert and oriented to person, place, and time.    Social History   Socioeconomic History  . Marital status: Married    Spouse name: Not on file  . Number of children: Not on file  . Years of education: Not on file  . Highest education level: Not on file  Occupational History  . Not on file  Social Needs  . Financial resource strain: Not on file  . Food insecurity:    Worry: Not  on file    Inability: Not on file  . Transportation needs:    Medical: Not on file    Non-medical: Not on file  Tobacco Use  . Smoking status: Former Smoker    Types: Cigarettes  . Smokeless tobacco: Never Used  . Tobacco comment: 3 cigarettes per week.  Substance and Sexual Activity  . Alcohol use: Yes    Comment: Sometimes.  . Drug use: No  . Sexual activity: Not on file  Lifestyle  . Physical activity:    Days per week: Not on file    Minutes per session: Not on file  . Stress: Not on file  Relationships  . Social connections:    Talks on phone: Not on file    Gets together: Not on file    Attends religious service: Not on file    Active member of club or organization: Not on file    Attends meetings of clubs or organizations: Not on file    Relationship status: Not on file  . Intimate partner violence:    Fear of current or ex partner: Not on file    Emotionally abused: Not on file    Physically abused: Not on file    Forced sexual activity: Not on file  Other Topics Concern  . Not on file  Social History Narrative  . Not on file    History reviewed. No pertinent family history.  Assessment &  Plan:   See Encounters Tab for problem based charting.  Patient seen with Dr. Angelia Mould

## 2017-11-25 NOTE — Assessment & Plan Note (Addendum)
Pt admitted on 4/17 for acute diverticulitis.  His case was severe with microperforation and inflammatory change seen on CT.  He received 4 days of IV antibiotics and was sent home on cipro and flagyl for 3 days further or oral therapy.  He continues to have abdominal pain that is severe at times but overall better since his hospitalization.  He denies any fevers or chills,  bloody diarrhea, he has had some small amount of greasy stool but notes that he still is limited to a mostly liquid diet due to pain.    -will add metamucil daily to patient's regimen instructed to take at night or at least two hours after other medication -advised to continue miralax, can back off if metamucil does the job, told to titrate as needed -Gave pt PRN lactulose for refractory constipation -prescribed short course of tramadol 50mg  for breakthrough pain, continue tylenol  -extended abx for 5 more days for total of 12 day course as this was a severe diverticulitis and as pt still having significant pain -instructed pt to receive colonoscopy in 6 weeks (this will be difficult as pt has no insurance) gave pt options getting insurance vs paying in advance with the understanding that they may not be able to afford.   Advised to call anytime for referral if they decide to pay.

## 2017-11-25 NOTE — Patient Instructions (Addendum)
Joshua Wall, you are doing well.  Keep taking your antibiotics, which will help you heal.  You can introduce soft foods and gradually work your way up.  Take the tylenol for pain, I can give you a short course of tramadol to help with the pain.  If the pain gets worse, you develop fever, notice increased bloody diarrhea, please call us.  You will need to start taking a fiber supplement called metamucil please take this at least 2 hours after any other medications preferably at night.  This will help you go to the bathroom.  I will prescribe you another medicine called lactulose to use when constipated.  Take this as needed for refractory constipation. not daily, this can be added on to your miralax and metamucil.  If you are able please obtain insurance or consider paying for a colonoscopy after 6 weeks.  I can write you a referral just give [place] a call whatever you decide.

## 2017-11-25 NOTE — Assessment & Plan Note (Signed)
Lab Results  Component Value Date   HGBA1C 5.8 (H) 11/21/2017   Pt was adhering well to dietary changes and taking metformin daily as prescribed before diverticulitis occurred.  I encouraged pt to be proud of his improvement and that this diverticulitis should not overshadow the drastic improvement in his blood sugar control.  -continue metformin 2000mg  daily.

## 2017-11-26 NOTE — Discharge Summary (Signed)
Name: Joshua Wall MRN: [id number] DOB: 1985/11/15 32 y.o. PCP: Katherine Roan, MD  Date of Admission: 11/20/2017  6:31 PM Date of Discharge: 11/24/2017 Attending Physician: Dr. Lenice Pressman   Discharge Diagnosis: Principal Problem:   Acute diverticulitis Active Problems:   Type 2 diabetes mellitus without complication, without long-term current use of insulin (Joshua Wall)   Diverticulitis   Bacteremia   Discharge Medications: Allergies as of 11/24/2017   No Known Allergies     Medication List    STOP taking these medications   Insulin Syringe-Needle U-100 31G X 15/64" 0.3 ML Misc     TAKE these medications   dimenhyDRINATE 50 MG tablet Commonly known as:  DRAMAMINE Take 50 mg by mouth every 8 (eight) hours as needed for nausea.   diphenhydramine-acetaminophen 25-500 MG Tabs tablet Commonly known as:  TYLENOL PM Take 1 tablet by mouth daily as needed (for pain).   freestyle lancets Use as instructed   glucose blood test strip Commonly known as:  FREESTYLE PRECISION NEO TEST Use as instructed   metFORMIN 1000 MG tablet Commonly known as:  GLUCOPHAGE Take 1 tablet (1,000 mg total) by mouth 2 (two) times daily with a meal.   polyethylene glycol packet Commonly known as:  MIRALAX Take 17 g by mouth daily.       Disposition and follow-up:   Joshua Wall was discharged from [facility] in Good condition.  At the hospital follow up visit please address:  1.  --Assess for continued resolution of abdominal pain and maintaining diet --Encourage and continue to educate on bowel regimen use (discharged with daily miralax recommendation to start) in order to achieve soft BMs with no straining to avoid recurrence  --Ensure repeat blood cultures negative  --Assess onychomycosis-pt inquiring about terbinafine refill --Continue management of DM     2.  Labs / imaging needed at time of follow-up: None   3.  Pending labs/ test needing  follow-up: Blood Cultures  Follow-up Appointments:   Hospital Course by problem list:   Acute Diverticulitis Patient presented with progressive lower abdominal pain with associated nausea, vomiting, subjective fevers, and mild leukocytosis.  CT scan showed acute diverticulitis around the sigmoid colon with evidence of microperforation, no abscess.  He was started on IV antibiotics with ciprofloxacin and metronidazole which were eventually transitioned to equivalent p.o. antibiotics.  He was made n.p.o. for bowel rest, pain control, antiemetics provided.  He gradually improved and diet was advanced as tolerated.  Bowel movements return to normal and he had mild residual pain at time of discharge.  Prescriptions provided for antibiotic courses patient had follow-up in clinic the next day (previously scheduled for routine follow-up).  Positive Blood Culture Initial blood cultures drawn 4/17 and 1 out of 2 positive for gram negative rods which resulted as roseomonas species, appropriately covered with Cipro.  Repeat blood cultures were drawn on 4/20 were negative time of discharge and will continue to be monitored until no growth is finalized.  Type 2 DM Currently well controlled on outpatient regimen of metformin, A1c repeated on admission was 5.8.  Resumed metformin on discharge  Discharge Vitals:   BP (!) 130/92 (BP Location: Right Arm)   Pulse 70   Temp 99.8 F (37.7 C) (Oral)   Resp 15   Wt 215 lb (97.5 kg)   SpO2 96%   BMI 32.22 kg/m   Pertinent Labs, Studies, and Procedures:  CT Abd: Acute diverticulitis, with soft tissue inflammation at the mid sigmoid  colon, inflamed diverticula and minimal free air tracking into the adjacent mesentery, reflecting microperforation. Trace associated free fluid noted. No definite evidence of abscess.  Blood Culture 4/17: One of out of two: ROSEOMONAS SPECIES  Blood Cultures 4/20: No growth x 48 hrs   Discharge Instructions: Discharge  Instructions    Discharge instructions   Complete by:  As directed    Nice to see you Joshua Wall -Your abdominal infection (called diverticulitis) has improved. We will continue the antibiotics for a few more days (through April 24th) to fully treat the issue. The blood tests we took yesterday are not positive right now and we will continue to keep them in the lab for a few days to check  -Take Metronidazole 500 mg three times a day until no more pills are left -Take Ciprofloxacin 500 mg twice a day until no more pills are left -You may still have some pain occasionally. For this, you can take Tylenol 1000 mg every 6-8 hours and/or ibuprofen 800 mg every 8 hours when you need it. This will continue to help decrease the inflammation and pain  -To prevent this from happening again, it is important to have bowel movements without straining and not have constipation. To help this, you can take Miralax once a day. It is usually a powder than you can mix in with water. The goal is to have soft bowel movements with no straining.  -You can keep your routine appointment in the clinic. Your doctor can talk to you about your diabetes, toenails, bowel movements      Signed: Tawny Asal, MD 11/26/2017, 7:38 AM   Pager: [phone]

## 2017-11-26 NOTE — Progress Notes (Signed)
Internal Medicine Clinic Attending  I saw and evaluated the patient.  I personally confirmed the key portions of the history and exam documented by Dr. Shan Levans and I reviewed pertinent patient test results.  The assessment, diagnosis, and plan were formulated together and I agree with the documentation in the resident's note. Still with TTP of LLQ. Discussed expectant care RTC for worsening pain, fevers, hematochezia, would increase length of antibiotics to 68-37 days given complications and continued pain.  Will need colonoscopy in a few weeks.

## 2017-11-27 ENCOUNTER — Other Ambulatory Visit: Payer: Self-pay

## 2017-11-27 ENCOUNTER — Emergency Department (HOSPITAL_COMMUNITY): Payer: Medicaid Other

## 2017-11-27 ENCOUNTER — Inpatient Hospital Stay (HOSPITAL_COMMUNITY)
Admission: EM | Admit: 2017-11-27 | Discharge: 2017-12-03 | DRG: 392 | Disposition: A | Discharge status: Home / Self Care | Payer: Medicaid Other | Attending: Internal Medicine | Admitting: Internal Medicine

## 2017-11-27 ENCOUNTER — Telehealth: Payer: Self-pay | Admitting: *Deleted

## 2017-11-27 ENCOUNTER — Encounter (HOSPITAL_COMMUNITY): Payer: Self-pay | Admitting: Emergency Medicine

## 2017-11-27 DIAGNOSIS — E663 Overweight: Secondary | ICD-10-CM | POA: Diagnosis present

## 2017-11-27 DIAGNOSIS — K760 Fatty (change of) liver, not elsewhere classified: Secondary | ICD-10-CM | POA: Diagnosis present

## 2017-11-27 DIAGNOSIS — K5720 Diverticulitis of large intestine with perforation and abscess without bleeding: Secondary | ICD-10-CM | POA: Diagnosis not present

## 2017-11-27 DIAGNOSIS — E876 Hypokalemia: Secondary | ICD-10-CM | POA: Diagnosis not present

## 2017-11-27 DIAGNOSIS — Z6835 Body mass index (BMI) 35.0-35.9, adult: Secondary | ICD-10-CM

## 2017-11-27 DIAGNOSIS — Z87891 Personal history of nicotine dependence: Secondary | ICD-10-CM | POA: Diagnosis not present

## 2017-11-27 DIAGNOSIS — E119 Type 2 diabetes mellitus without complications: Secondary | ICD-10-CM | POA: Diagnosis present

## 2017-11-27 DIAGNOSIS — K5780 Diverticulitis of intestine, part unspecified, with perforation and abscess without bleeding: Secondary | ICD-10-CM

## 2017-11-27 DIAGNOSIS — Z8719 Personal history of other diseases of the digestive system: Secondary | ICD-10-CM | POA: Diagnosis present

## 2017-11-27 DIAGNOSIS — Z7984 Long term (current) use of oral hypoglycemic drugs: Secondary | ICD-10-CM | POA: Diagnosis not present

## 2017-11-27 DIAGNOSIS — K5792 Diverticulitis of intestine, part unspecified, without perforation or abscess without bleeding: Secondary | ICD-10-CM

## 2017-11-27 LAB — CBC
HCT: 40.9 % (ref 39.0–52.0)
Hemoglobin: 14.4 g/dL (ref 13.0–17.0)
MCH: 30 pg (ref 26.0–34.0)
MCHC: 35.2 g/dL (ref 30.0–36.0)
MCV: 85.2 fL (ref 78.0–100.0)
Platelets: 396 10*3/uL (ref 150–400)
RBC: 4.8 MIL/uL (ref 4.22–5.81)
RDW: 12.8 % (ref 11.5–15.5)
WBC: 10.1 10*3/uL (ref 4.0–10.5)

## 2017-11-27 LAB — COMPREHENSIVE METABOLIC PANEL
ALK PHOS: 60 U/L (ref 38–126)
ALT: 23 U/L (ref 17–63)
AST: 17 U/L (ref 15–41)
Albumin: 3.9 g/dL (ref 3.5–5.0)
Anion gap: 11 (ref 5–15)
BILIRUBIN TOTAL: 0.5 mg/dL (ref 0.3–1.2)
BUN: 6 mg/dL (ref 6–20)
CALCIUM: 9.1 mg/dL (ref 8.9–10.3)
CO2: 24 mmol/L (ref 22–32)
Chloride: 98 mmol/L — ABNORMAL LOW (ref 101–111)
Creatinine, Ser: 0.74 mg/dL (ref 0.61–1.24)
GFR calc non Af Amer: >60 mL/min (ref >60)
Glucose, Bld: 152 mg/dL — ABNORMAL HIGH (ref 65–99)
Potassium: 3.6 mmol/L (ref 3.5–5.1)
Sodium: 133 mmol/L — ABNORMAL LOW (ref 135–145)
TOTAL PROTEIN: 7.9 g/dL (ref 6.5–8.1)

## 2017-11-27 LAB — URINALYSIS, ROUTINE W REFLEX MICROSCOPIC
Bacteria, UA: NONE SEEN
Bilirubin Urine: NEGATIVE
Glucose, UA: NEGATIVE mg/dL
HGB URINE DIPSTICK: NEGATIVE
KETONES UR: NEGATIVE mg/dL
NITRITE: NEGATIVE
PROTEIN: 30 mg/dL — AB
Specific Gravity, Urine: 1.028 (ref 1.005–1.030)
pH: 5 (ref 5.0–8.0)

## 2017-11-27 LAB — LIPASE, BLOOD: Lipase: 23 U/L (ref 11–51)

## 2017-11-27 MED ORDER — PIPERACILLIN-TAZOBACTAM 3.375 G IVPB 30 MIN
3.3750 g | Freq: Once | INTRAVENOUS | Status: AC
  Administered 2017-11-27: 3.375 g via INTRAVENOUS
  Filled 2017-11-27: qty 50

## 2017-11-27 MED ORDER — ONDANSETRON HCL 4 MG/2ML IJ SOLN
4.0000 mg | Freq: Once | INTRAMUSCULAR | Status: AC
  Administered 2017-11-27: 4 mg via INTRAVENOUS
  Filled 2017-11-27: qty 2

## 2017-11-27 MED ORDER — OXYCODONE-ACETAMINOPHEN 5-325 MG PO TABS
1.0000 | ORAL_TABLET | Freq: Once | ORAL | Status: AC
  Administered 2017-11-27: 1 via ORAL
  Filled 2017-11-27: qty 1

## 2017-11-27 MED ORDER — HYDROMORPHONE HCL 2 MG/ML IJ SOLN
1.0000 mg | Freq: Once | INTRAMUSCULAR | Status: AC
Start: 2017-11-27 — End: 2017-11-27
  Administered 2017-11-27: 1 mg via INTRAVENOUS
  Filled 2017-11-27: qty 1

## 2017-11-27 MED ORDER — HYDROMORPHONE HCL 2 MG/ML IJ SOLN
1.0000 mg | INTRAMUSCULAR | Status: DC | PRN
  Administered 2017-11-27 – 2017-11-28 (×4): 1 mg via INTRAVENOUS
  Filled 2017-11-27 (×4): qty 1

## 2017-11-27 MED ORDER — METRONIDAZOLE IN NACL 5-0.79 MG/ML-% IV SOLN
500.0000 mg | Freq: Once | INTRAVENOUS | Status: AC
  Administered 2017-11-27: 500 mg via INTRAVENOUS
  Filled 2017-11-27: qty 100

## 2017-11-27 MED ORDER — IOPAMIDOL (ISOVUE-300) INJECTION 61%
100.0000 mL | Freq: Once | INTRAVENOUS | Status: AC | PRN
  Administered 2017-11-27: 100 mL via INTRAVENOUS

## 2017-11-27 MED ORDER — INSULIN ASPART 100 UNIT/ML ~~LOC~~ SOLN
0.0000 [IU] | Freq: Three times a day (TID) | SUBCUTANEOUS | Status: DC
  Administered 2017-11-28: 2 [IU] via SUBCUTANEOUS
  Administered 2017-12-03: 3 [IU] via SUBCUTANEOUS

## 2017-11-27 MED ORDER — MORPHINE SULFATE (PF) 4 MG/ML IV SOLN
4.0000 mg | Freq: Once | INTRAVENOUS | Status: AC
  Administered 2017-11-27: 4 mg via INTRAVENOUS
  Filled 2017-11-27: qty 1

## 2017-11-27 MED ORDER — IOPAMIDOL (ISOVUE-300) INJECTION 61%
INTRAVENOUS | Status: AC
Start: 2017-11-27 — End: 2017-11-28
  Filled 2017-11-27: qty 100

## 2017-11-27 NOTE — ED Provider Notes (Signed)
[facility] EMERGENCY DEPARTMENT Provider Note   CSN: [id number] Arrival date & time: 11/27/17  1159     History   Chief Complaint Chief Complaint  Patient presents with  . Abdominal Pain    HPI Royale Swamy is a 32 y.o. male with history of diabetes on metformin, recently treated diverticulitis that required admission (discharged 4/21) here for evaluation of return of severe, constant right lower quadrant/suprapubic abdominal pain x 2 days.  Associated symptoms include nausea, nonbilious nonbloody emesis and subjective fever yesterday.  Was discharged with antibiotics and tramadol which he has been compliant with.  Has been having smaller, harder bowel movements, passing gas.  He denies any dysuria, hematuria, blood in his stools.  States that driving over bumps, stopping while driving to the hospital made his abdominal pain worse.  No history of abdominal surgeries. Aggravating factors: palpation, movement. No alleviating factors.   HPI  Past Medical History:  Diagnosis Date  . Acute diverticulitis 11/2017  . Diabetes mellitus without complication [facility])     Patient Active Problem List   Diagnosis Date Noted  . Bacteremia 11/23/2017  . Acute diverticulitis 11/21/2017  . Diverticulitis 11/21/2017  . Toenail fungus 07/02/2017  . Type 2 diabetes mellitus without complication, without long-term current use of insulin ([place]) 02/04/2017  . Candidal balanitis 02/04/2017    Past Surgical History:  Procedure Laterality Date  . NO PAST SURGERIES          Home Medications    Prior to Admission medications   Medication Sig Start Date End Date Taking? Authorizing Provider  ciprofloxacin (CIPRO) 500 MG tablet Take 1 tablet (500 mg total) by mouth 2 (two) times daily for 5 days. 11/28/17 12/03/17 Yes Katherine Roan, MD  metFORMIN (GLUCOPHAGE) 1000 MG tablet Take 1 tablet (1,000 mg total) by mouth 2 (two) times daily with a meal. 03/05/17  Yes Tawny Asal,  MD  metroNIDAZOLE (FLAGYL) 500 MG tablet Take 1 tablet (500 mg total) by mouth 3 (three) times daily for 5 days. 11/28/17 12/03/17 Yes WinfreyJenne Pane, MD  polyethylene glycol [facility]) packet Take 17 g by mouth daily. 11/24/17  Yes Tawny Asal, MD  psyllium (METAMUCIL) 58.6 % powder Take 1 packet by mouth daily.   Yes [provider]  traMADol (ULTRAM) 50 MG tablet Take 1 tablet (50 mg total) by mouth every 12 (twelve) hours as needed for up to 3 days for severe pain. 11/25/17 11/28/17 Yes Katherine Roan, MD  Lactulose 20 GM/30ML SOLN Take 30 mLs (20 g total) by mouth as needed (Take one dose as needed for refractory constipation). Patient not taking: Reported on 11/27/2017 11/25/17   Katherine Roan, MD  Lancets (FREESTYLE) lancets Use as instructed Patient not taking: Reported on 11/27/2017 07/01/17   Katherine Roan, MD    Family History History reviewed. No pertinent family history.  Social History Social History   Tobacco Use  . Smoking status: Former Smoker    Types: Cigarettes  . Smokeless tobacco: Never Used  . Tobacco comment: 3 cigarettes per week.  Substance Use Topics  . Alcohol use: Yes    Comment: Sometimes.  . Drug use: No     Allergies   Patient has no known allergies.   Review of Systems Review of Systems  Constitutional: Positive for fever.  Gastrointestinal: Positive for abdominal pain, nausea and vomiting.  All other systems reviewed and are negative.    Physical Exam Updated Vital Signs BP 123/85  Pulse 64   Temp 98.7 F (37.1 C) (Oral)   Resp (!) 24   SpO2 97%   Physical Exam  Constitutional: He is oriented to person, place, and time. He appears well-developed and well-nourished. No distress.  Looks uncomfortable.  HENT:  Head: Normocephalic and atraumatic.  Nose: Nose normal.  Mouth/Throat: No oropharyngeal exudate.  Moist mucous membranes   Eyes: Pupils are equal, round, and reactive to light. Conjunctivae and EOM are  normal.  Neck: Normal range of motion.  Cardiovascular: Normal rate, regular rhythm, normal heart sounds and intact distal pulses.  No murmur heard. 2+ DP and radial pulses bilaterally. No LE edema.   Pulmonary/Chest: Effort normal and breath sounds normal.  Abdominal: Soft. Bowel sounds are normal. There is tenderness in the right lower quadrant and suprapubic area.  Positive McBurney's.  No G/R/R. No CVA tenderness.   Musculoskeletal: Normal range of motion. He exhibits no deformity.  Neurological: He is alert and oriented to person, place, and time.  Skin: Skin is warm and dry. Capillary refill takes less than 2 seconds.  Psychiatric: He has a normal mood and affect. His behavior is normal. Judgment and thought content normal.  Nursing note and vitals reviewed.    ED Treatments / Results  Labs (all labs ordered are listed, but only abnormal results are displayed) Labs Reviewed  COMPREHENSIVE METABOLIC PANEL - Abnormal; Notable for the following components:      Result Value   Sodium 133 (*)    Chloride 98 (*)    Glucose, Bld 152 (*)    All other components within normal limits  URINALYSIS, ROUTINE W REFLEX MICROSCOPIC - Abnormal; Notable for the following components:   Color, Urine AMBER (*)    Protein, ur 30 (*)    Leukocytes, UA TRACE (*)    All other components within normal limits  CULTURE, BLOOD (ROUTINE X 2)  CULTURE, BLOOD (ROUTINE X 2)  LIPASE, BLOOD  CBC    EKG None  Radiology Ct Abdomen Pelvis W Contrast  Result Date: 11/27/2017 CLINICAL DATA:  History of diverticulitis with continued pain EXAM: CT ABDOMEN AND PELVIS WITH CONTRAST TECHNIQUE: Multidetector CT imaging of the abdomen and pelvis was performed using the standard protocol following bolus administration of intravenous contrast. CONTRAST:  116mL ISOVUE-300 IOPAMIDOL (ISOVUE-300) INJECTION 61% COMPARISON:  11/11/2017 FINDINGS: Lower chest: Lung bases demonstrate no acute consolidation or effusion.  Normal heart size. Hepatobiliary: Hepatic steatosis. No calcified gallstones or biliary dilatation Pancreas: Unremarkable. No pancreatic ductal dilatation or surrounding inflammatory changes. Spleen: Normal in size without focal abnormality. Adrenals/Urinary Tract: Adrenal glands are unremarkable. Kidneys are normal, without renal calculi, focal lesion, or hydronephrosis. Bladder is unremarkable. Stomach/Bowel: The stomach is nonenlarged. No dilated small bowel. Focal wall thickening involving the mid to distal sigmoid colon with diverticula present consistent with acute diverticulitis. Extraluminal gas and fluid collection without strong rim enhancement. Vascular/Lymphatic: No significant vascular findings are present. No enlarged abdominal or pelvic lymph nodes. Reproductive: Prostate is unremarkable. Other: Fat in the inguinal canals. Small fat in the umbilical region Musculoskeletal: No acute or significant osseous findings. IMPRESSION: 1. Continued wall thickening of the mid to distal sigmoid colon consistent with ongoing diverticulitis. Increased focal extraluminal gas and fluid collection, consistent with localized perforation and phlegmon/probable developing abscess although no discrete rim enhancing fluid collection seen at this time. 2. Hepatic steatosis Electronically Signed   By: Donavan Foil M.D.   On: 11/27/2017 19:26    Procedures Procedures (including critical care time)  Medications Ordered in ED Medications  iopamidol (ISOVUE-300) 61 % injection (has no administration in time range)  piperacillin-tazobactam (ZOSYN) IVPB 3.375 g (has no administration in time range)  metroNIDAZOLE (FLAGYL) IVPB 500 mg (has no administration in time range)  oxyCODONE-acetaminophen (PERCOCET/ROXICET) 5-325 MG per tablet 1 tablet (1 tablet Oral Given 11/27/17 1210)  morphine 4 MG/ML injection 4 mg (4 mg Intravenous Given 11/27/17 1726)  ondansetron (ZOFRAN) injection 4 mg (4 mg Intravenous Given 11/27/17  1726)  iopamidol (ISOVUE-300) 61 % injection 100 mL (100 mLs Intravenous Contrast Given 11/27/17 1909)  HYDROmorphone (DILAUDID) injection 1 mg (1 mg Intravenous Given 11/27/17 1947)     Initial Impression / Assessment and Plan / ED Course  I have reviewed the triage vital signs and the nursing notes.  Pertinent labs & imaging results that were available during my care of the patient were reviewed by me and considered in my medical decision making (see chart for details).  Clinical Course as of Nov 27 2012  Wed Nov 27, 2017  1935 IMPRESSION: 1. Continued wall thickening of the mid to distal sigmoid colon consistent with ongoing diverticulitis. Increased focal extraluminal gas and fluid collection, consistent with localized perforation and phlegmon/probable developing abscess although no discrete rim enhancing fluid collection seen at this time. 2. Hepatic steatosis   Electronically Signed By: Donavan Foil M.D. On: 11/27/2017 19:26  CT ABDOMEN PELVIS W CONTRAST [CG]    Clinical Course User Index [CG] Kinnie Feil, PA-C   Differential diagnosis includes perforation, abscess, appy, UTI/pyelo.  Vital signs within normal limits.  CBC, CMP, lipase, urinalysis were reviewed and reassuring.  Pending CT abdominal/pelvis.  1930: CT shows ongoing diverticulitis with increased extraluminal gas/fluid collection with phlegmon vs developing abscess.  Will consult general surgery.  Final Clinical Impressions(s) / ED Diagnoses   2015: Patient evaluated by general surgery Dr. Hulen Skains in the ED, recommending admission and IV antibiotics.  Patient may need OR.  Blood cultures ordered.  Will consult internal medicine for admission. Final diagnoses:  Diverticulitis    ED Discharge Orders    None       Arlean Hopping 11/27/17 2014    Davonna Belling, MD 11/27/17 2139

## 2017-11-27 NOTE — ED Triage Notes (Signed)
Patient presents to ED after being admitted last week for diverticulitis.  States pain has not subsided, and today it is significantly worse.  PAtient states he has been taking his antibiotics at home.  Patient c/o fever, nause and vomtiing, and oily/wet stools ("very litte").

## 2017-11-27 NOTE — ED Notes (Signed)
Patient transported to CT 

## 2017-11-27 NOTE — ED Notes (Signed)
Pt c/o pain, Dr. Alvino Chapel notified

## 2017-11-27 NOTE — Telephone Encounter (Signed)
Pt sig other calls and states he would like some stronger pain med, would like it called in, offered appt in am [facility], pt will come in at [facility]

## 2017-11-27 NOTE — H&P (Signed)
Date: 11/28/2017               Patient Name:  Joshua Wall MRN: [id number]  DOB: [birth year] Age / Sex: 32 y.o., male   PCP: Katherine Roan, MD         Medical Service: Internal Medicine Teaching Service         Attending Physician: Dr. Aldine Contes, MD    First Contact: Dr. Johny Chess Pager: [phone]  Second Contact: Dr. Heber Gu [place] Pager: [phone]       After Hours (After 5p/  First Contact Pager: [phone]  weekends / holidays): Second Contact Pager: [phone]   Chief Complaint: abdominal pain  History of Present Illness:  32 yo male with PMH of T2DM and recent hospitalization for acute diverticulitis (discharged 4/21) presenting with progressively worsening lower abdominal pain. Patient states on discharge, he felt improved but the pain never went away entirely. He endorses compliance with discharge antibiotics. He states the pain increased in intensity last night noting it in his lower abdomen and radiating to his testicles. The pain became so bad he decided to come to the ED to be revaluated. The comes and goes and is crampy in quality, worse with movement. The pain medicine helps for about an hour. He endorses 1 episode of vomiting, loss of appetite, some bloating, and subjective fever. He alsoendorses abdominal pain with urination. He denies diarrhea, constipation (last BM small amount, green), or abdominal distension.  ED Course: Vitals: Blood pressure 147/99, pulse 80, temperature 98.4 F (36.9 C), temperature source Oral, resp. rate 20, SpO2 94 %. Labs: CMET and CBC within normal limits Meds: zosyn, flagyl, percocet, zofran, morphine, dilaudid  Imaging: CT abdomen/pelvis -  localized perforation and phlegmon/probable developing abscess Consults: general surgery  Meds:  Current Meds  Medication Sig  . ciprofloxacin (CIPRO) 500 MG tablet Take 1 tablet (500 mg total) by mouth 2 (two) times daily for 5 days.  . metFORMIN (GLUCOPHAGE) 1000 MG tablet Take 1 tablet (1,000  mg total) by mouth 2 (two) times daily with a meal.  . metroNIDAZOLE (FLAGYL) 500 MG tablet Take 1 tablet (500 mg total) by mouth 3 (three) times daily for 5 days.  . polyethylene glycol (MIRALAX) packet Take 17 g by mouth daily.  . psyllium (METAMUCIL) 58.6 % powder Take 1 packet by mouth daily.  . traMADol (ULTRAM) 50 MG tablet Take 1 tablet (50 mg total) by mouth every 12 (twelve) hours as needed for up to 3 days for severe pain.  . [DISCONTINUED] diphenhydramine-acetaminophen (TYLENOL PM) 25-500 MG TABS tablet Take 1 tablet by mouth daily as needed (for pain).     Allergies: Allergies as of 11/27/2017  . (No Known Allergies)   Past Medical History:  Diagnosis Date  . Acute diverticulitis 11/2017  . Diabetes mellitus without complication ([place])     Family History:  History reviewed. No pertinent family history.  Social History:  Social History   Tobacco Use  . Smoking status: Former Smoker    Types: Cigarettes  . Smokeless tobacco: Never Used  . Tobacco comment: 3 cigarettes per week.  Substance Use Topics  . Alcohol use: Yes    Comment: Sometimes.  . Drug use: No    Review of Systems: A complete ROS was negative except as per HPI.   Physical Exam: Blood pressure 107/71, pulse 86, temperature 98.7 F (37.1 C), temperature source Oral, resp. rate 18, height 5\' 10"  (1.778 m), weight 246 lb (111.6 kg), SpO2 100 %.  Physical Exam  Constitutional: He is oriented to person, place, and time. He appears well-developed and well-nourished. He appears distressed (wincing in pain, writhing around bed).  HENT:  Head: Normocephalic and atraumatic.  Cardiovascular: Normal rate, regular rhythm, normal heart sounds and intact distal pulses. Exam reveals no gallop and no friction rub.  No murmur heard. Pulmonary/Chest: Effort normal and breath sounds normal. No respiratory distress. He has no wheezes.  Abdominal: Soft. Bowel sounds are normal. He exhibits no distension. There is  tenderness in the right lower quadrant, periumbilical area, suprapubic area and left lower quadrant. There is no rebound.  Diffuse TTP, worse in the LLQ, suprapubic, and periumbilical   Musculoskeletal: Normal range of motion. He exhibits no edema.  Neurological: He is alert and oriented to person, place, and time. He has normal strength. No cranial nerve deficit.  Skin: Skin is warm and dry.  Psychiatric: He has a normal mood and affect.   EKG: none to review  CXR: none to review  CT abd/pelvis w contrast 11/27/2017: 1. Continued wall thickening of the mid to distal sigmoid colon consistent with ongoing diverticulitis. Increased focal extraluminal gas and fluid collection, consistent with localized perforation and phlegmon/probable developing abscess although no discrete rim enhancing fluid collection seen at this time. 2. Hepatic steatosis  Assessment & Plan by Problem: Active Problems:   Diverticulitis  Acute diverticulitis with possible developing abscess Failed outpatient management with flagyl/cipro; CT concerning for abscess development. Dr. Hulen Skains with [facility] evaluated patient, per EDP; recommendation for IV antibiotics with zosyn/flagyl and he anticipates patient will likely need surgical management. Exam not concerning for peritonitis at this time.  -Zosyn/flagyl -Dilaudid 1mg  q3hr prn for pain with holding parameters -Clear liq diet overnight, NPO at midnight in case of need for surgery tomorrow -Surgery on board, appreciate their assistance  T2DM At home on metformin 1000mg  BID.  -Hold metformin  -SSI-M  Dispo: Admit patient to Inpatient with expected length of stay greater than 2 midnights.  Signed: Melanee Spry, MD 11/28/2017, 1:18 AM  Pager: [phone]

## 2017-11-28 ENCOUNTER — Ambulatory Visit: Payer: Self-pay

## 2017-11-28 ENCOUNTER — Other Ambulatory Visit: Payer: Self-pay

## 2017-11-28 ENCOUNTER — Inpatient Hospital Stay (HOSPITAL_COMMUNITY): Payer: Medicaid Other

## 2017-11-28 LAB — CULTURE, BLOOD (ROUTINE X 2)
Culture: NO GROWTH
Culture: NO GROWTH
Special Requests: ADEQUATE
Special Requests: ADEQUATE

## 2017-11-28 LAB — CBC
HCT: 39.3 % (ref 39.0–52.0)
Hemoglobin: 13.5 g/dL (ref 13.0–17.0)
MCH: 29.5 pg (ref 26.0–34.0)
MCHC: 34.4 g/dL (ref 30.0–36.0)
MCV: 86 fL (ref 78.0–100.0)
PLATELETS: 366 10*3/uL (ref 150–400)
RBC: 4.57 MIL/uL (ref 4.22–5.81)
RDW: 12.8 % (ref 11.5–15.5)
WBC: 9 10*3/uL (ref 4.0–10.5)

## 2017-11-28 LAB — GLUCOSE, CAPILLARY
GLUCOSE-CAPILLARY: 117 mg/dL — AB (ref 65–99)
GLUCOSE-CAPILLARY: 118 mg/dL — AB (ref 65–99)
Glucose-Capillary: 126 mg/dL — ABNORMAL HIGH (ref 65–99)
Glucose-Capillary: 96 mg/dL (ref 65–99)

## 2017-11-28 LAB — PROTIME-INR
INR: 1.13
PROTHROMBIN TIME: 14.4 s (ref 11.4–15.2)

## 2017-11-28 LAB — BASIC METABOLIC PANEL
Anion gap: 11 (ref 5–15)
BUN: 9 mg/dL (ref 6–20)
CALCIUM: 8.9 mg/dL (ref 8.9–10.3)
CO2: 23 mmol/L (ref 22–32)
CREATININE: 0.85 mg/dL (ref 0.61–1.24)
Chloride: 102 mmol/L (ref 101–111)
Glucose, Bld: 144 mg/dL — ABNORMAL HIGH (ref 65–99)
Potassium: 3.7 mmol/L (ref 3.5–5.1)
SODIUM: 136 mmol/L (ref 135–145)

## 2017-11-28 MED ORDER — ACETAMINOPHEN 325 MG PO TABS: 650.0000 mg | ORAL_TABLET | Freq: Four times a day (QID) | ORAL | Status: DC | PRN

## 2017-11-28 MED ORDER — KETOROLAC TROMETHAMINE 30 MG/ML IJ SOLN
30.0000 mg | Freq: Once | INTRAMUSCULAR | Status: AC
  Administered 2017-11-28: 30 mg via INTRAVENOUS
  Filled 2017-11-28: qty 1

## 2017-11-28 MED ORDER — ACETAMINOPHEN 650 MG RE SUPP: 650.0000 mg | Freq: Four times a day (QID) | RECTAL | Status: DC | PRN

## 2017-11-28 MED ORDER — HYDROMORPHONE HCL 2 MG/ML IJ SOLN
1.0000 mg | INTRAMUSCULAR | Status: DC | PRN
  Administered 2017-11-28: 1 mg via INTRAVENOUS
  Administered 2017-11-28 – 2017-11-29 (×9): 2 mg via INTRAVENOUS
  Filled 2017-11-28 (×10): qty 1

## 2017-11-28 MED ORDER — METRONIDAZOLE IN NACL 5-0.79 MG/ML-% IV SOLN
500.0000 mg | Freq: Three times a day (TID) | INTRAVENOUS | Status: DC
  Administered 2017-11-28 – 2017-12-02 (×12): 500 mg via INTRAVENOUS
  Filled 2017-11-28 (×14): qty 100

## 2017-11-28 MED ORDER — PIPERACILLIN-TAZOBACTAM 3.375 G IVPB
3.3750 g | Freq: Three times a day (TID) | INTRAVENOUS | Status: DC
  Administered 2017-11-28 – 2017-11-29 (×5): 3.375 g via INTRAVENOUS
  Filled 2017-11-28 (×6): qty 50

## 2017-11-28 MED ORDER — LACTATED RINGERS IV SOLN
INTRAVENOUS | Status: AC
  Administered 2017-11-28: 10:00:00 via INTRAVENOUS

## 2017-11-28 NOTE — Progress Notes (Addendum)
   Subjective: No acute events following admission. Reports continued pain relieved for short periods with pain medication. Has had increased pain with flatulence. Wife updated on re-evaluation following initial rounds, questions answered.    Objective:  Vital signs in last 24 hours: Vitals:   11/28/17 0300 11/28/17 0400 11/28/17 0539 11/28/17 0606  BP: 111/65 115/72 112/75 127/80  Pulse: 62 (!) 59 (!) 59 72  Resp:    20  Temp:    (!) 97.5 F (36.4 C)  TempSrc:    Oral  SpO2: 94% 96% 95% 99%  Weight:      Height:       General: Resting in bed, appears mildly uncomfortable  CV: RRR, no murmur appreciated  Resp: Normal work of breathing, no distress  Abd: Obese, bowel sounds present but hypoactive, tenderness to palpation of lower abdomen without guarding or rebound  Extr: No LE edema  Neuro: Alert and oriented x3  Skin: Warm, dry      Assessment/Plan:  Acute Diverticulitis, ? Phlegmon v Developing Abscess   Patient presented with increased abdominal pain following discharge for acute diverticulitis.  Had been taking appropriate antibiotics as prescribed and had received several days of IV abx at last admission.  Repeat CT showed ongoing diverticulitis with a gas/fluid collection concerning for a phlegmon or developing abscess (no ring-enhancing definitive abscess at the time).  Is currently afebrile, no leukocytosis.  IV antibiotics have been restarted with Zosyn and Flagyl.  Surgery has been consulted and following patient for potential surgical intervention, appreciate their assistance. --NPO, maintenance IVF- LR 75 cc/hr  --Cont Zosyn, Metronidazole --Pain control: Tylenol + dilaudid 1-2 mg q2hr prn    Dispo: Anticipated discharge pending clinical improvement  Tawny Asal, MD 11/28/2017, 1:40 PM Pager: [phone]

## 2017-11-28 NOTE — Progress Notes (Signed)
[facility] Progress Note     Subjective: CC: abdominal pain Pain across lower abdomen. Was recently discharged on oral antibiotics for same episode of diverticulitis. Denies nausea. Passing flatus, but states it is very difficult. Pain worse with movement and only relieved with pain medication. Pain in abdomen with urination but no feculent urine and no passage of air with urination.   Objective: Vital signs in last 24 hours: Temp:  [97.5 F (36.4 C)-98.7 F (37.1 C)] 97.5 F (36.4 C) (04/25 0606) Pulse Rate:  [58-87] 72 (04/25 0606) Resp:  [12-24] 20 (04/25 0606) BP: (107-147)/(62-99) 127/80 (04/25 0606) SpO2:  [93 %-100 %] 99 % (04/25 0606) Weight:  [111.6 kg (246 lb)] 111.6 kg (246 lb) (04/24 2033) Last BM Date: 11/27/17  Intake/Output from previous day: 04/24 0701 - 04/25 0700 In: 300 [IV Piggyback:300] Out: 400 [Urine:400] Intake/Output this shift: No intake/output data recorded.  PE: Gen:  Alert, NAD, pleasant Card:  Regular rate and rhythm, pedal pulses 2+ BL Pulm:  Normal effort, clear to auscultation bilaterally Abd: Soft, diffusely tender across low abdomen, mildly distended, bowel sounds hypoactive, no HSM Skin: warm and dry, no rashes  Psych: A&Ox3   Lab Results:  Recent Labs    11/27/17 1214 11/28/17 0331  WBC 10.1 9.0  HGB 14.4 13.5  HCT 40.9 39.3  PLT 396 366   BMET Recent Labs    11/27/17 1214 11/28/17 0331  NA 133* 136  K 3.6 3.7  CL 98* 102  CO2 24 23  GLUCOSE 152* 144*  BUN 6 9  CREATININE 0.74 0.85  CALCIUM 9.1 8.9   PT/INR Recent Labs    11/28/17 0752  LABPROT 14.4  INR 1.13   CMP     Component Value Date/Time   NA 136 11/28/2017 0331   K 3.7 11/28/2017 0331   CL 102 11/28/2017 0331   CO2 23 11/28/2017 0331   GLUCOSE 144 (H) 11/28/2017 0331   BUN 9 11/28/2017 0331   CREATININE 0.85 11/28/2017 0331   CALCIUM 8.9 11/28/2017 0331   PROT 7.9 11/27/2017 1214   ALBUMIN 3.9 11/27/2017 1214   AST 17 11/27/2017  1214   ALT 23 11/27/2017 1214   ALKPHOS 60 11/27/2017 1214   BILITOT 0.5 11/27/2017 1214   GFRNONAA >60 11/28/2017 0331   GFRAA >60 11/28/2017 0331   Lipase     Component Value Date/Time   LIPASE 23 11/27/2017 1214       Studies/Results: Ct Abdomen Pelvis W Contrast  Result Date: 11/27/2017 CLINICAL DATA:  History of diverticulitis with continued pain EXAM: CT ABDOMEN AND PELVIS WITH CONTRAST TECHNIQUE: Multidetector CT imaging of the abdomen and pelvis was performed using the standard protocol following bolus administration of intravenous contrast. CONTRAST:  113mL ISOVUE-300 IOPAMIDOL (ISOVUE-300) INJECTION 61% COMPARISON:  11/11/2017 FINDINGS: Lower chest: Lung bases demonstrate no acute consolidation or effusion. Normal heart size. Hepatobiliary: Hepatic steatosis. No calcified gallstones or biliary dilatation Pancreas: Unremarkable. No pancreatic ductal dilatation or surrounding inflammatory changes. Spleen: Normal in size without focal abnormality. Adrenals/Urinary Tract: Adrenal glands are unremarkable. Kidneys are normal, without renal calculi, focal lesion, or hydronephrosis. Bladder is unremarkable. Stomach/Bowel: The stomach is nonenlarged. No dilated small bowel. Focal wall thickening involving the mid to distal sigmoid colon with diverticula present consistent with acute diverticulitis. Extraluminal gas and fluid collection without strong rim enhancement. Vascular/Lymphatic: No significant vascular findings are present. No enlarged abdominal or pelvic lymph nodes. Reproductive: Prostate is unremarkable. Other: Fat in the inguinal canals. Small  fat in the umbilical region Musculoskeletal: No acute or significant osseous findings. IMPRESSION: 1. Continued wall thickening of the mid to distal sigmoid colon consistent with ongoing diverticulitis. Increased focal extraluminal gas and fluid collection, consistent with localized perforation and phlegmon/probable developing abscess although  no discrete rim enhancing fluid collection seen at this time. 2. Hepatic steatosis Electronically Signed   By: Donavan Foil M.D.   On: 11/27/2017 19:26    Anti-infectives: Anti-infectives (From admission, onward)   Start     Dose/Rate Route Frequency Ordered Stop   11/28/17 0600  metroNIDAZOLE (FLAGYL) IVPB 500 mg     500 mg 100 mL/hr over 60 Minutes Intravenous Every 8 hours 11/28/17 0216     11/28/17 0300  piperacillin-tazobactam (ZOSYN) IVPB 3.375 g     3.375 g 12.5 mL/hr over 240 Minutes Intravenous Every 8 hours 11/28/17 0216     11/27/17 2015  piperacillin-tazobactam (ZOSYN) IVPB 3.375 g     3.375 g 100 mL/hr over 30 Minutes Intravenous  Once 11/27/17 2013 11/27/17 2102   11/27/17 2015  metroNIDAZOLE (FLAGYL) IVPB 500 mg     500 mg 100 mL/hr over 60 Minutes Intravenous  Once 11/27/17 2013 11/27/17 2210       Assessment/Plan T2DM - SSI  Acute diverticulitis with concern for developing abscess - CT 4/24: Continued wall thickening of the mid to distal sigmoid colon consistent with ongoing diverticulitis. Increased focal extraluminal gas and fluid collection, consistent with localized perforation and phlegmon/probable developing abscess although no discrete rim enhancing fluid collection seen at this time - WBC 9.0, afebrile - very TTP across lower abdomen - continue IV abx and bowel rest, discussed possible surgery if this does not resolve with conservative management  FEN: NPO, IVF VTE: SCDs ID: Zosyn/flagyl 4/24>>  LOS: 1 day    Brigid Re , [facility] 11/28/2017, 9:17 AM Pager: [phone] Consults: [phone] Mon-Fri 7:00 am-4:30 pm Sat-Sun 7:00 am-11:30 am

## 2017-11-28 NOTE — Plan of Care (Signed)
  Problem: Clinical Measurements: Goal: Ability to maintain clinical measurements within normal limits will improve Outcome: Progressing   Problem: Clinical Measurements: Goal: Will remain free from infection Outcome: Progressing   Problem: Clinical Measurements: Goal: Respiratory complications will improve Outcome: Progressing   Problem: Activity: Goal: Risk for activity intolerance will decrease Outcome: Progressing   Problem: Coping: Goal: Level of anxiety will decrease Outcome: Progressing   Problem: Safety: Goal: Ability to remain free from injury will improve Outcome: Progressing

## 2017-11-28 NOTE — Progress Notes (Signed)
Pt states that his pain in getting increasing worse RN paged attending and general surgery waiting for call back

## 2017-11-28 NOTE — Progress Notes (Signed)
Patient ID: Joshua Wall, male   DOB: 07-Apr-1986, 32 y.o.   MRN: [id number]  Request for diverticular abscess drain placement  Dr Vernard Gambles has reviewed imaging Feels collection is not well defined Small collection not amenable to a drain catheter placement  Rec: see how pt does over next few days If need IR to reconsider -- please call  Informed Joshua Wall --- agreeable to plan

## 2017-11-28 NOTE — Consult Note (Signed)
Reason for Consult:Acute recurrent diverticulitis Referring Physician: Dr. Richardine Service Joshua Wall is an 32 y.o. male.  HPI: Readmitted after being discharge 3 days PTA.  Diverticulitis which worsened on oral antibiotics.  Now has a phlegmon and possible central posterior abscess with chills and fevers at home.  Being admitted for IV antibiotics.  I interviewed the patient and examined him with a Spanish interpreter .  Past Medical History:  Diagnosis Date  . Acute diverticulitis 11/2017  . Diabetes mellitus without complication [facility])     Past Surgical History:  Procedure Laterality Date  . NO PAST SURGERIES      History reviewed. No pertinent family history.  Social History:  reports that he has quit smoking. His smoking use included cigarettes. He has never used smokeless tobacco. He reports that he drinks alcohol. He reports that he does not use drugs.  Allergies: No Known Allergies  Medications: I have reviewed the patient's current medications.  Results for orders placed or performed during the hospital encounter of 11/27/17 (from the past 48 hour(s))  Lipase, blood     Status: None   Collection Time: 11/27/17 12:14 PM  Result Value Ref Range   Lipase 23 11 - 51 U/L    Comment: Performed at [facility], [place] [address], [place], [place] [address]  Comprehensive metabolic panel     Status: Abnormal   Collection Time: 11/27/17 12:14 PM  Result Value Ref Range   Sodium 133 (L) 135 - 145 mmol/L   Potassium 3.6 3.5 - 5.1 mmol/L   Chloride 98 (L) 101 - 111 mmol/L   CO2 24 22 - 32 mmol/L   Glucose, Bld 152 (H) 65 - 99 mg/dL   BUN 6 6 - 20 mg/dL   Creatinine, Ser 0.74 0.61 - 1.24 mg/dL   Calcium 9.1 8.9 - 10.3 mg/dL   Total Protein 7.9 6.5 - 8.1 g/dL   Albumin 3.9 3.5 - 5.0 g/dL   AST 17 15 - 41 U/L   ALT 23 17 - 63 U/L   Alkaline Phosphatase 60 38 - 126 U/L   Total Bilirubin 0.5 0.3 - 1.2 mg/dL   GFR calc non Af Amer >60 >60 mL/min   GFR calc Af Amer >60  >60 mL/min    Comment: (NOTE) The eGFR has been calculated using the CKD EPI equation. This calculation has not been validated in all clinical situations. eGFR's persistently <60 mL/min signify possible Chronic Kidney Disease.    Anion gap 11 5 - 15    Comment: Performed at [facility] [address] [place] [address]  CBC     Status: None   Collection Time: 11/27/17 12:14 PM  Result Value Ref Range   WBC 10.1 4.0 - 10.5 K/uL   RBC 4.80 4.22 - 5.81 MIL/uL   Hemoglobin 14.4 13.0 - 17.0 g/dL   HCT 40.9 39.0 - 52.0 %   MCV 85.2 78.0 - 100.0 fL   MCH 30.0 26.0 - 34.0 pg   MCHC 35.2 30.0 - 36.0 g/dL   RDW 12.8 11.5 - 15.5 %   Platelets 396 150 - 400 K/uL    Comment: Performed at [facility] [address] [place], [place] [address]  Urinalysis, Routine w reflex microscopic     Status: Abnormal   Collection Time: 11/27/17  4:32 PM  Result Value Ref Range   Color, Urine AMBER (A) YELLOW    Comment: BIOCHEMICALS MAY BE AFFECTED BY COLOR  APPearance CLEAR CLEAR   Specific Gravity, Urine 1.028 1.005 - 1.030   pH 5.0 5.0 - 8.0   Glucose, UA NEGATIVE NEGATIVE mg/dL   Hgb urine dipstick NEGATIVE NEGATIVE   Bilirubin Urine NEGATIVE NEGATIVE   Ketones, ur NEGATIVE NEGATIVE mg/dL   Protein, ur 30 (A) NEGATIVE mg/dL   Nitrite NEGATIVE NEGATIVE   Leukocytes, UA TRACE (A) NEGATIVE   RBC / HPF 0-5 0 - 5 RBC/hpf   WBC, UA 0-5 0 - 5 WBC/hpf   Bacteria, UA NONE SEEN NONE SEEN   Squamous Epithelial / LPF 0-5 0 - 5    Comment: Please note change in reference range.   Mucus PRESENT     Comment: Performed at [facility], [place] [address] [place] [address]  CBC     Status: None   Collection Time: 11/28/17  3:31 AM  Result Value Ref Range   WBC 9.0 4.0 - 10.5 K/uL   RBC 4.57 4.22 - 5.81 MIL/uL   Hemoglobin 13.5 13.0 - 17.0 g/dL   HCT 39.3 39.0 - 52.0 %   MCV 86.0 78.0 - 100.0 fL   MCH 29.5 26.0 - 34.0 pg   MCHC 34.4 30.0 - 36.0 g/dL   RDW 12.8 11.5  - 15.5 %   Platelets 366 150 - 400 K/uL    Comment: Performed at [facility] [address], [place], [place] [address]  Basic metabolic panel     Status: Abnormal   Collection Time: 11/28/17  3:31 AM  Result Value Ref Range   Sodium 136 135 - 145 mmol/L   Potassium 3.7 3.5 - 5.1 mmol/L   Chloride 102 101 - 111 mmol/L   CO2 23 22 - 32 mmol/L   Glucose, Bld 144 (H) 65 - 99 mg/dL   BUN 9 6 - 20 mg/dL   Creatinine, Ser 0.85 0.61 - 1.24 mg/dL   Calcium 8.9 8.9 - 10.3 mg/dL   GFR calc non Af Amer >60 >60 mL/min   GFR calc Af Amer >60 >60 mL/min    Comment: (NOTE) The eGFR has been calculated using the CKD EPI equation. This calculation has not been validated in all clinical situations. eGFR's persistently <60 mL/min signify possible Chronic Kidney Disease.    Anion gap 11 5 - 15    Comment: Performed at [facility] [address], [place], [place] [address]  Glucose, capillary     Status: Abnormal   Collection Time: 11/28/17  6:15 AM  Result Value Ref Range   Glucose-Capillary 117 (H) 65 - 99 mg/dL    Ct Abdomen Pelvis W Contrast  Result Date: 11/27/2017 CLINICAL DATA:  History of diverticulitis with continued pain EXAM: CT ABDOMEN AND PELVIS WITH CONTRAST TECHNIQUE: Multidetector CT imaging of the abdomen and pelvis was performed using the standard protocol following bolus administration of intravenous contrast. CONTRAST:  194m ISOVUE-300 IOPAMIDOL (ISOVUE-300) INJECTION 61% COMPARISON:  11/11/2017 FINDINGS: Lower chest: Lung bases demonstrate no acute consolidation or effusion. Normal heart size. Hepatobiliary: Hepatic steatosis. No calcified gallstones or biliary dilatation Pancreas: Unremarkable. No pancreatic ductal dilatation or surrounding inflammatory changes. Spleen: Normal in size without focal abnormality. Adrenals/Urinary Tract: Adrenal glands are unremarkable. Kidneys are normal, without renal calculi, focal lesion, or hydronephrosis. Bladder is unremarkable.  Stomach/Bowel: The stomach is nonenlarged. No dilated small bowel. Focal wall thickening involving the mid to distal sigmoid colon with diverticula present consistent with acute diverticulitis. Extraluminal gas and fluid collection without strong rim  enhancement. Vascular/Lymphatic: No significant vascular findings are present. No enlarged abdominal or pelvic lymph nodes. Reproductive: Prostate is unremarkable. Other: Fat in the inguinal canals. Small fat in the umbilical region Musculoskeletal: No acute or significant osseous findings. IMPRESSION: 1. Continued wall thickening of the mid to distal sigmoid colon consistent with ongoing diverticulitis. Increased focal extraluminal gas and fluid collection, consistent with localized perforation and phlegmon/probable developing abscess although no discrete rim enhancing fluid collection seen at this time. 2. Hepatic steatosis Electronically Signed   By: Donavan Foil M.D.   On: 11/27/2017 19:26    Review of Systems  Constitutional: Positive for chills and fever.  Gastrointestinal: Positive for abdominal pain, nausea and vomiting.  All other systems reviewed and are negative.  Blood pressure 127/80, pulse 72, temperature (!) 97.5 F (36.4 C), temperature source Oral, resp. rate 20, height '5\' 10"'  (1.778 m), weight 111.6 kg (246 lb), SpO2 99 %. Physical Exam  Constitutional: He is oriented to person, place, and time. He appears well-developed.  Slightly overweight  Cardiovascular: Normal rate and regular rhythm.  No murmur heard. Respiratory: Effort normal and breath sounds normal.  GI: Soft. Normal appearance. Bowel sounds are decreased. There is tenderness in the right lower quadrant and left lower quadrant.    Neurological: He is alert and oriented to person, place, and time. He has normal reflexes.  Skin: Skin is warm and dry.  Psychiatric: He has a normal mood and affect. His behavior is normal. Thought content normal.     Assessment/Plan: Acute diverticulitis with deep, central phlegmon/abscess with more free air than before, may be possibly able to drain.  Will consider IR consultation.  IV antibiotics..  We will follow   Judeth Horn 11/28/2017, 7:19 AM

## 2017-11-29 LAB — CBC
HEMATOCRIT: 37.7 % — AB (ref 39.0–52.0)
HEMOGLOBIN: 12.8 g/dL — AB (ref 13.0–17.0)
MCH: 29.6 pg (ref 26.0–34.0)
MCHC: 34 g/dL (ref 30.0–36.0)
MCV: 87.1 fL (ref 78.0–100.0)
Platelets: 373 10*3/uL (ref 150–400)
RBC: 4.33 MIL/uL (ref 4.22–5.81)
RDW: 12.8 % (ref 11.5–15.5)
WBC: 8.4 10*3/uL (ref 4.0–10.5)

## 2017-11-29 LAB — BASIC METABOLIC PANEL
ANION GAP: 11 (ref 5–15)
BUN: 14 mg/dL (ref 6–20)
CO2: 25 mmol/L (ref 22–32)
CREATININE: 0.91 mg/dL (ref 0.61–1.24)
Calcium: 8.9 mg/dL (ref 8.9–10.3)
Chloride: 103 mmol/L (ref 101–111)
GFR calc non Af Amer: >60 mL/min (ref >60)
Glucose, Bld: 117 mg/dL — ABNORMAL HIGH (ref 65–99)
Potassium: 3.3 mmol/L — ABNORMAL LOW (ref 3.5–5.1)
Sodium: 139 mmol/L (ref 135–145)

## 2017-11-29 LAB — GLUCOSE, CAPILLARY
GLUCOSE-CAPILLARY: 105 mg/dL — AB (ref 65–99)
GLUCOSE-CAPILLARY: 79 mg/dL (ref 65–99)
Glucose-Capillary: 113 mg/dL — ABNORMAL HIGH (ref 65–99)
Glucose-Capillary: 90 mg/dL (ref 65–99)

## 2017-11-29 MED ORDER — PIPERACILLIN-TAZOBACTAM 3.375 G IVPB
3.3750 g | Freq: Three times a day (TID) | INTRAVENOUS | Status: DC
  Administered 2017-11-30 – 2017-12-03 (×10): 3.375 g via INTRAVENOUS
  Filled 2017-11-29 (×13): qty 50

## 2017-11-29 MED ORDER — LACTATED RINGERS IV SOLN
INTRAVENOUS | Status: AC
  Administered 2017-11-29: 13:00:00 via INTRAVENOUS

## 2017-11-29 MED ORDER — POTASSIUM CHLORIDE 10 MEQ/100ML IV SOLN
10.0000 meq | INTRAVENOUS | Status: AC
  Administered 2017-11-29 (×3): 10 meq via INTRAVENOUS
  Filled 2017-11-29 (×3): qty 100

## 2017-11-29 NOTE — Progress Notes (Signed)
   Subjective: This morning, patient reports pain has improved, though did have a period of increased pain yesterday afternoon which was relieved by Toradol, KUB unremarkable at the time.  He notes his mouth is dry, no nausea or vomiting  Objective:  Vital signs in last 24 hours: Vitals:   11/28/17 0606 11/28/17 1547 11/28/17 2120 11/29/17 0427  BP: 127/80 120/87 118/74 123/66  Pulse: 72 82 79 88  Resp: 20 18 18 18   Temp: (!) 97.5 F (36.4 C) 98.1 F (36.7 C) 98.4 F (36.9 C) 98.2 F (36.8 C)  TempSrc: Oral Oral Oral Oral  SpO2: 99% 96% 100% 96%  Weight:      Height:       General: Resting in bed, appears mildly uncomfortable  CV: RRR, no murmur appreciated  Resp: Normal work of breathing, no distress  Abd: Obese, +BS, tenderness to palpation of lower abdomen-improved from prior   Extr: No LE edema  Neuro: Alert and oriented x3  Skin: Warm, dry      Assessment/Plan:  Acute Diverticulitis, ? Phlegmon v Developing Abscess   Patient presented with increased abdominal pain following discharge for acute diverticulitis.  Had been taking appropriate antibiotics as prescribed and had received several days of IV abx at last admission.  Repeat CT showed ongoing diverticulitis with a gas/fluid collection concerning for a phlegmon or developing abscess (no ring-enhancing definitive abscess at the time).  Has been afebrile, no leukocytosis. On IV Zosyn and Flagyl.  Surgery has been consulted and following patient for potential surgical intervention pending response, appreciate their assistance. --NPO, maintenance IVF-LR 75 cc/hr  --Cont Zosyn, Metronidazole --Pain control: Tylenol + dilaudid 1-2 mg q2hr prn-wean as improves    Dispo: Anticipated discharge pending clinical improvement  Tawny Asal, MD 11/29/2017, 12:38 PM Pager: [phone]

## 2017-11-29 NOTE — Progress Notes (Signed)
[facility] Progress Note     Subjective: CC: abdominal pain Had some worsening of pain yesterday afternoon, but this morning he is much more comfortable and states the pain is minimal. No nausea, passing flatus..   Objective: Vital signs in last 24 hours: Temp:  [98.1 F (36.7 C)-98.4 F (36.9 C)] 98.2 F (36.8 C) (04/26 0427) Pulse Rate:  [79-88] 88 (04/26 0427) Resp:  [18] 18 (04/26 0427) BP: (118-123)/(66-87) 123/66 (04/26 0427) SpO2:  [96 %-100 %] 96 % (04/26 0427) Last BM Date: 11/27/17  Intake/Output from previous day: 04/25 0701 - 04/26 0700 In: 488.8 [I.V.:338.8; IV Piggyback:150] Out: -  Intake/Output this shift: No intake/output data recorded.  PE: Gen:  Alert, NAD, pleasant Card:  Regular rate and rhythm, pedal pulses 2+ BL Pulm:  Normal effort, clear to auscultation bilaterally Abd: Soft, nondistended, mildly tender in the lower fields Skin: warm and dry, no rashes  Psych: A&Ox3   Lab Results:  Recent Labs    11/27/17 1214 11/28/17 0331  WBC 10.1 9.0  HGB 14.4 13.5  HCT 40.9 39.3  PLT 396 366   BMET Recent Labs    11/27/17 1214 11/28/17 0331  NA 133* 136  K 3.6 3.7  CL 98* 102  CO2 24 23  GLUCOSE 152* 144*  BUN 6 9  CREATININE 0.74 0.85  CALCIUM 9.1 8.9   PT/INR Recent Labs    11/28/17 0752  LABPROT 14.4  INR 1.13   CMP     Component Value Date/Time   NA 136 11/28/2017 0331   K 3.7 11/28/2017 0331   CL 102 11/28/2017 0331   CO2 23 11/28/2017 0331   GLUCOSE 144 (H) 11/28/2017 0331   BUN 9 11/28/2017 0331   CREATININE 0.85 11/28/2017 0331   CALCIUM 8.9 11/28/2017 0331   PROT 7.9 11/27/2017 1214   ALBUMIN 3.9 11/27/2017 1214   AST 17 11/27/2017 1214   ALT 23 11/27/2017 1214   ALKPHOS 60 11/27/2017 1214   BILITOT 0.5 11/27/2017 1214   GFRNONAA >60 11/28/2017 0331   GFRAA >60 11/28/2017 0331   Lipase     Component Value Date/Time   LIPASE 23 11/27/2017 1214       Studies/Results: Ct Abdomen Pelvis W  Contrast  Result Date: 11/27/2017 CLINICAL DATA:  History of diverticulitis with continued pain EXAM: CT ABDOMEN AND PELVIS WITH CONTRAST TECHNIQUE: Multidetector CT imaging of the abdomen and pelvis was performed using the standard protocol following bolus administration of intravenous contrast. CONTRAST:  162mL ISOVUE-300 IOPAMIDOL (ISOVUE-300) INJECTION 61% COMPARISON:  11/11/2017 FINDINGS: Lower chest: Lung bases demonstrate no acute consolidation or effusion. Normal heart size. Hepatobiliary: Hepatic steatosis. No calcified gallstones or biliary dilatation Pancreas: Unremarkable. No pancreatic ductal dilatation or surrounding inflammatory changes. Spleen: Normal in size without focal abnormality. Adrenals/Urinary Tract: Adrenal glands are unremarkable. Kidneys are normal, without renal calculi, focal lesion, or hydronephrosis. Bladder is unremarkable. Stomach/Bowel: The stomach is nonenlarged. No dilated small bowel. Focal wall thickening involving the mid to distal sigmoid colon with diverticula present consistent with acute diverticulitis. Extraluminal gas and fluid collection without strong rim enhancement. Vascular/Lymphatic: No significant vascular findings are present. No enlarged abdominal or pelvic lymph nodes. Reproductive: Prostate is unremarkable. Other: Fat in the inguinal canals. Small fat in the umbilical region Musculoskeletal: No acute or significant osseous findings. IMPRESSION: 1. Continued wall thickening of the mid to distal sigmoid colon consistent with ongoing diverticulitis. Increased focal extraluminal gas and fluid collection, consistent with localized perforation and phlegmon/probable developing  abscess although no discrete rim enhancing fluid collection seen at this time. 2. Hepatic steatosis Electronically Signed   By: Donavan Foil M.D.   On: 11/27/2017 19:26   Dg Abd Acute W/chest  Result Date: 11/28/2017 CLINICAL DATA:  Worsened abdominal pain.  Recent diverticulitis. EXAM:  DG ABDOMEN ACUTE W/ 1V CHEST COMPARISON:  CT yesterday. FINDINGS: One-view chest shows normal heart and mediastinal shadows. Lungs are clear. No free air seen under the diaphragm. Normal amount of gas in small and large bowel. Central pelvic/lower abdominal abscess cannot be specifically seen. No sign of obstruction. No abnormal calcifications or bone findings. IMPRESSION: Radiographs are within normal limits. No plain radiographic finding relating to the diverticular abscess. No bowel obstruction or free air. Electronically Signed   By: Nelson Chimes M.D.   On: 11/28/2017 17:34    Anti-infectives: Anti-infectives (From admission, onward)   Start     Dose/Rate Route Frequency Ordered Stop   11/28/17 0600  metroNIDAZOLE (FLAGYL) IVPB 500 mg     500 mg 100 mL/hr over 60 Minutes Intravenous Every 8 hours 11/28/17 0216     11/28/17 0300  piperacillin-tazobactam (ZOSYN) IVPB 3.375 g     3.375 g 12.5 mL/hr over 240 Minutes Intravenous Every 8 hours 11/28/17 0216     11/27/17 2015  piperacillin-tazobactam (ZOSYN) IVPB 3.375 g     3.375 g 100 mL/hr over 30 Minutes Intravenous  Once 11/27/17 2013 11/27/17 2102   11/27/17 2015  metroNIDAZOLE (FLAGYL) IVPB 500 mg     500 mg 100 mL/hr over 60 Minutes Intravenous  Once 11/27/17 2013 11/27/17 2210       Assessment/Plan T2DM - SSI  Acute diverticulitis with concern for developing abscess - CT 4/24: Continued wall thickening of the mid to distal sigmoid colon consistent with ongoing diverticulitis. Increased focal extraluminal gas and fluid collection, consistent with localized perforation and phlegmon/probable developing abscess although no discrete rim enhancing fluid collection seen at this time - WBC 9.0, afebrile - Pain/tenderness seems improved this morning - continue IV abx and bowel rest. If continuing to have pain only, would consider rescanning him in a few days to assess feasibility of drain placement. If still no window, he will require  Hartman's.  FEN: NPO, IVF VTE: SCDs ID: Zosyn/flagyl 4/24>>  LOS: 2 days    Clovis Riley , [facility] 11/29/2017, 7:33 AM

## 2017-11-29 NOTE — Plan of Care (Signed)
  Problem: Education: Goal: Knowledge of General Education information will improve Outcome: Progressing   Problem: Pain Managment: Goal: General experience of comfort will improve Outcome: Progressing   Problem: Safety: Goal: Ability to remain free from injury will improve Outcome: Progressing   

## 2017-11-29 NOTE — Plan of Care (Signed)
?  Problem: Elimination: ?Goal: Will not experience complications related to bowel motility ?Outcome: Progressing ?  ?Problem: Pain Managment: ?Goal: General experience of comfort will improve ?Outcome: Progressing ?  ?Problem: Safety: ?Goal: Ability to remain free from injury will improve ?Outcome: Progressing ?  ?

## 2017-11-29 NOTE — Progress Notes (Signed)
Initial Nutrition Assessment  DOCUMENTATION CODES:   Obesity unspecified  INTERVENTION:    Advance diet as medically appropriate, add supplements when able  NUTRITION DIAGNOSIS:   Inadequate oral intake related to altered GI function as evidenced by NPO status  GOAL:   Patient will meet greater than or equal to 90% of their needs  MONITOR:   Diet advancement, PO intake, Labs, Skin, Weight trends, I & O's  REASON FOR ASSESSMENT:   Malnutrition Screening Tool  ASSESSMENT:   32 yo Male with PMH of diabetes and recent hospitalization for acute diverticulitis (discharged on April 21) who presented to the ED with progressively worsening lower abdominal pain.  RD spoke with pt's wife. Pt sitting on couch. Wanting to shower. Wife reports pt has no appetite for food; only beverages.  She is wondering if pt can be on a Clear Liquid diet.  Pt was eating well before onset of abdominal pain. Last time pt ate was Tues, 4/23 (chicken noodle soup). Plan for now is for bowel rest with NPO status.  Labs and medications reviewed. K 3.3 (L). CBG's [id number].  NUTRITION - FOCUSED PHYSICAL EXAM:  Completed. No muscle or fat depletion noticed.  Diet Order:  Diet NPO time specified Except for: Ice Chips  EDUCATION NEEDS:   No education needs have been identified at this time  Skin:  Skin Assessment: Reviewed RN Assessment  Last BM:  4/24   Intake/Output Summary (Last 24 hours) at 11/29/2017 1335 Last data filed at 11/28/2017 1449 Gross per 24 hour  Intake 488.75 ml  Output -  Net 488.75 ml   Height:   Ht Readings from Last 1 Encounters:  11/27/17 5\' 10"  (1.778 m)   Weight:   Wt Readings from Last 1 Encounters:  11/27/17 246 lb (111.6 kg)   Ideal Body Weight:  75.4 kg  BMI:  Body mass index is 35.3 kg/m.  Estimated Nutritional Needs:   Kcal:  2200-2400  Protein:  110-125 gm  Fluid:  2.2-2.4 L  Arthur Holms, RD, LDN Pager #: [phone] After-Hours Pager  #: [phone]

## 2017-11-30 DIAGNOSIS — E876 Hypokalemia: Secondary | ICD-10-CM

## 2017-11-30 LAB — CBC
HEMATOCRIT: 37.4 % — AB (ref 39.0–52.0)
HEMOGLOBIN: 12.8 g/dL — AB (ref 13.0–17.0)
MCH: 29.9 pg (ref 26.0–34.0)
MCHC: 34.2 g/dL (ref 30.0–36.0)
MCV: 87.4 fL (ref 78.0–100.0)
Platelets: 398 10*3/uL (ref 150–400)
RBC: 4.28 MIL/uL (ref 4.22–5.81)
RDW: 13.1 % (ref 11.5–15.5)
WBC: 6.5 10*3/uL (ref 4.0–10.5)

## 2017-11-30 LAB — BASIC METABOLIC PANEL
ANION GAP: 12 (ref 5–15)
BUN: 12 mg/dL (ref 6–20)
CALCIUM: 9 mg/dL (ref 8.9–10.3)
CO2: 27 mmol/L (ref 22–32)
Chloride: 101 mmol/L (ref 101–111)
Creatinine, Ser: 0.87 mg/dL (ref 0.61–1.24)
GFR calc non Af Amer: >60 mL/min (ref >60)
GLUCOSE: 98 mg/dL (ref 65–99)
Potassium: 3.4 mmol/L — ABNORMAL LOW (ref 3.5–5.1)
Sodium: 140 mmol/L (ref 135–145)

## 2017-11-30 LAB — GLUCOSE, CAPILLARY
GLUCOSE-CAPILLARY: 78 mg/dL (ref 65–99)
GLUCOSE-CAPILLARY: 77 mg/dL (ref 65–99)
Glucose-Capillary: 98 mg/dL (ref 65–99)

## 2017-11-30 MED ORDER — POTASSIUM CHLORIDE 10 MEQ/100ML IV SOLN
10.0000 meq | Freq: Once | INTRAVENOUS | Status: AC
  Administered 2017-11-30: 10 meq via INTRAVENOUS
  Filled 2017-11-30: qty 100

## 2017-11-30 MED ORDER — LACTATED RINGERS IV SOLN
INTRAVENOUS | Status: AC
  Administered 2017-11-30: 12:00:00 via INTRAVENOUS

## 2017-11-30 MED ORDER — POTASSIUM CHLORIDE 10 MEQ/100ML IV SOLN
10.0000 meq | INTRAVENOUS | Status: AC
  Administered 2017-11-30 (×3): 10 meq via INTRAVENOUS
  Filled 2017-11-30 (×4): qty 100

## 2017-11-30 NOTE — Progress Notes (Signed)
   Subjective:  Patient evaluated this morning.  States his abdominal pain continues to improve. States he did not require pain medication overnight.  He says he gets occasional sharp pains in the left and right lower quadrants sporadically over the day. Reports a bowel movement over night.  He denies fever/chills. States he would like to drink liquids.  Objective:  Vital signs in last 24 hours: Vitals:   11/29/17 1253 11/29/17 2057 11/30/17 0455 11/30/17 0508  BP: 122/87 133/75 117/70 130/75  Pulse: 78 68 65 86  Resp: 20     Temp: 98.6 F (37 C) 98.6 F (37 C) 98.7 F (37.1 C) 98.2 F (36.8 C)  TempSrc: Oral Oral Oral Oral  SpO2: 95% 96% 97% 98%  Weight:      Height:       Physical Exam  Constitutional: He is well-developed, well-nourished, and in no distress.  Cardiovascular: Normal rate, regular rhythm and normal heart sounds. Exam reveals no gallop and no friction rub.  No murmur heard. Pulmonary/Chest: Effort normal and breath sounds normal. No respiratory distress. He has no wheezes. He has no rales.  Abdominal: Soft. Bowel sounds are normal. He exhibits no distension.  Mild tenderness on right and left lower quadrants  Skin: Skin is warm and dry.     Assessment/Plan:  Active Problems:   Diverticulitis  Acute diverticulitis complicated by phlegmon versus developing abscess Patient is on IV Zosyn and metronidazole with improvement in his abdominal pain. He states he had a bowel movement yesterday. He has been afebrile with no leukocytosis. Surgery following. -Currently NPO for bowel rest per surgery -LR - IV Zosyn and metronidazole - IV dilaudid if needed  Hypokalemia 3.4.  Repleating -bmet in am  Dispo: Anticipated discharge pending continued improvement in condition.   Kalman Shan Hickox, DO 11/30/2017, 11:24 AM Pager: [phone]

## 2017-11-30 NOTE — Progress Notes (Signed)
[facility] Progress Note     Subjective: CC: abdominal pain Pain somewhat improved from yesterday. Feels thirsty. Denies nausea. Had loose BM yesterday, non-bloody.  UOP good. VSS.   Objective: Vital signs in last 24 hours: Temp:  [98.2 F (36.8 C)-98.7 F (37.1 C)] 98.2 F (36.8 C) (04/27 0508) Pulse Rate:  [65-86] 86 (04/27 0508) Resp:  [20] 20 (04/26 1253) BP: (117-133)/(70-87) 130/75 (04/27 0508) SpO2:  [95 %-98 %] 98 % (04/27 0508) Last BM Date: 11/27/17  Intake/Output from previous day: 04/26 0701 - 04/27 0700 In: 300 [IV Piggyback:300] Out: -  Intake/Output this shift: No intake/output data recorded.  PE: Gen:  Alert, NAD, pleasant Card:  Regular rate and rhythm, pedal pulses 2+ BL Pulm:  Normal effort, clear to auscultation bilaterally Abd: Soft, mildly TTP across lower abdomen, non-distended, bowel sounds present in all 4 quadrants, no HSM Skin: warm and dry, no rashes  Psych: A&Ox3   Lab Results:  Recent Labs    11/29/17 0641 11/30/17 0543  WBC 8.4 6.5  HGB 12.8* 12.8*  HCT 37.7* 37.4*  PLT 373 398   BMET Recent Labs    11/29/17 0641 11/30/17 0543  NA 139 140  K 3.3* 3.4*  CL 103 101  CO2 25 27  GLUCOSE 117* 98  BUN 14 12  CREATININE 0.91 0.87  CALCIUM 8.9 9.0   PT/INR Recent Labs    11/28/17 0752  LABPROT 14.4  INR 1.13   CMP     Component Value Date/Time   NA 140 11/30/2017 0543   K 3.4 (L) 11/30/2017 0543   CL 101 11/30/2017 0543   CO2 27 11/30/2017 0543   GLUCOSE 98 11/30/2017 0543   BUN 12 11/30/2017 0543   CREATININE 0.87 11/30/2017 0543   CALCIUM 9.0 11/30/2017 0543   PROT 7.9 11/27/2017 1214   ALBUMIN 3.9 11/27/2017 1214   AST 17 11/27/2017 1214   ALT 23 11/27/2017 1214   ALKPHOS 60 11/27/2017 1214   BILITOT 0.5 11/27/2017 1214   GFRNONAA >60 11/30/2017 0543   GFRAA >60 11/30/2017 0543   Lipase     Component Value Date/Time   LIPASE 23 11/27/2017 1214       Studies/Results: Dg Abd Acute  W/chest  Result Date: 11/28/2017 CLINICAL DATA:  Worsened abdominal pain.  Recent diverticulitis. EXAM: DG ABDOMEN ACUTE W/ 1V CHEST COMPARISON:  CT yesterday. FINDINGS: One-view chest shows normal heart and mediastinal shadows. Lungs are clear. No free air seen under the diaphragm. Normal amount of gas in small and large bowel. Central pelvic/lower abdominal abscess cannot be specifically seen. No sign of obstruction. No abnormal calcifications or bone findings. IMPRESSION: Radiographs are within normal limits. No plain radiographic finding relating to the diverticular abscess. No bowel obstruction or free air. Electronically Signed   By: Nelson Chimes M.D.   On: 11/28/2017 17:34    Anti-infectives: Anti-infectives (From admission, onward)   Start     Dose/Rate Route Frequency Ordered Stop   11/30/17 0600  piperacillin-tazobactam (ZOSYN) IVPB 3.375 g     3.375 g 12.5 mL/hr over 240 Minutes Intravenous Every 8 hours 11/29/17 2056     11/28/17 0600  metroNIDAZOLE (FLAGYL) IVPB 500 mg     500 mg 100 mL/hr over 60 Minutes Intravenous Every 8 hours 11/28/17 0216     11/28/17 0300  piperacillin-tazobactam (ZOSYN) IVPB 3.375 g  Status:  Discontinued     3.375 g 12.5 mL/hr over 240 Minutes Intravenous Every 8 hours 11/28/17 0216  11/29/17 2056   11/27/17 2015  piperacillin-tazobactam (ZOSYN) IVPB 3.375 g     3.375 g 100 mL/hr over 30 Minutes Intravenous  Once 11/27/17 2013 11/27/17 2102   11/27/17 2015  metroNIDAZOLE (FLAGYL) IVPB 500 mg     500 mg 100 mL/hr over 60 Minutes Intravenous  Once 11/27/17 2013 11/27/17 2210       Assessment/Plan T2DM - SSI  Acute diverticulitis with concern for developing abscess - CT 4/24: Continued wall thickening of the mid to distal sigmoid colon consistent with ongoing diverticulitis. Increased focal extraluminal gas and fluid collection, consistent with localized perforation and phlegmon/probable developing abscess although no discrete rim enhancing fluid  collection seen at this time - WBC 6.5, afebrile - Pain/tenderness seems improved this morning - continue IV abx and bowel rest. If continuing to have pain only, would consider rescanning him in a few days to assess feasibility of drain placement. If still no window, he will require Hartman's. Hypokalemia - K 3.4, replace IV  FEN: NPO with ice chips, IVF VTE: SCDs ID: Zosyn/flagyl 4/24>>    LOS: 3 days    Brigid Re , [facility] 11/30/2017, 10:13 AM Pager: [phone] Consults: [phone] Mon-Fri 7:00 am-4:30 pm Sat-Sun 7:00 am-11:30 am

## 2017-11-30 NOTE — Plan of Care (Signed)
?  Problem: Elimination: ?Goal: Will not experience complications related to bowel motility ?Outcome: Progressing ?  ?Problem: Pain Managment: ?Goal: General experience of comfort will improve ?Outcome: Progressing ?  ?Problem: Safety: ?Goal: Ability to remain free from injury will improve ?Outcome: Progressing ?  ?

## 2017-12-01 LAB — CBC
HEMATOCRIT: 36.6 % — AB (ref 39.0–52.0)
Hemoglobin: 12.3 g/dL — ABNORMAL LOW (ref 13.0–17.0)
MCH: 28.8 pg (ref 26.0–34.0)
MCHC: 33.6 g/dL (ref 30.0–36.0)
MCV: 85.7 fL (ref 78.0–100.0)
PLATELETS: 403 10*3/uL — AB (ref 150–400)
RBC: 4.27 MIL/uL (ref 4.22–5.81)
RDW: 12.4 % (ref 11.5–15.5)
WBC: 6 10*3/uL (ref 4.0–10.5)

## 2017-12-01 LAB — BASIC METABOLIC PANEL
Anion gap: 12 (ref 5–15)
BUN: 9 mg/dL (ref 6–20)
CHLORIDE: 102 mmol/L (ref 101–111)
CO2: 23 mmol/L (ref 22–32)
CREATININE: 0.92 mg/dL (ref 0.61–1.24)
Calcium: 8.3 mg/dL — ABNORMAL LOW (ref 8.9–10.3)
GFR calc non Af Amer: >60 mL/min (ref >60)
Glucose, Bld: 83 mg/dL (ref 65–99)
POTASSIUM: 3.3 mmol/L — AB (ref 3.5–5.1)
SODIUM: 137 mmol/L (ref 135–145)

## 2017-12-01 LAB — GLUCOSE, CAPILLARY
GLUCOSE-CAPILLARY: 83 mg/dL (ref 65–99)
Glucose-Capillary: 82 mg/dL (ref 65–99)
Glucose-Capillary: 78 mg/dL (ref 65–99)
Glucose-Capillary: 74 mg/dL (ref 65–99)
Glucose-Capillary: 114 mg/dL — ABNORMAL HIGH (ref 65–99)

## 2017-12-01 MED ORDER — POTASSIUM CHLORIDE 2 MEQ/ML IV SOLN
INTRAVENOUS | Status: DC
  Administered 2017-12-01: 14:00:00 via INTRAVENOUS
  Filled 2017-12-01: qty 1000

## 2017-12-01 MED ORDER — POTASSIUM CHLORIDE CRYS ER 20 MEQ PO TBCR
40.0000 meq | EXTENDED_RELEASE_TABLET | Freq: Once | ORAL | Status: AC
  Administered 2017-12-01: 40 meq via ORAL
  Filled 2017-12-01: qty 2

## 2017-12-01 NOTE — Progress Notes (Signed)
  Subjective: He has finally turned the corner and is doing much better Denies pain Hungry Having bowel movements WBC 6000.  Potassium 3.3  Objective: Vital signs in last 24 hours: Temp:  [98.4 F (36.9 C)-98.9 F (37.2 C)] 98.8 F (37.1 C) (04/28 1359) Pulse Rate:  [61-63] 63 (04/28 1359) Resp:  [20] 20 (04/28 1359) BP: (112-126)/(71-78) 123/78 (04/28 1359) SpO2:  [98 %-99 %] 99 % (04/28 1359) Last BM Date: 11/30/17(as per patient)  Intake/Output from previous day: 04/27 0701 - 04/28 0700 In: 240 [P.O.:240] Out: -  Intake/Output this shift: Total I/O In: 200 [IV Piggyback:200] Out: -    PE: Gen:  Alert, NAD, pleasant Card:  Regular rate and rhythm, pedal pulses 2+ BL Pulm:  Normal effort, clear to auscultation bilaterally Abd: Soft,  now completely nontender.  No guarding.  No mass.  In all 4 quadrants, no HSM Skin: warm and dry, no rashes  Psych: A&Ox3    Lab Results:  Recent Labs    11/30/17 0543 12/01/17 0558  WBC 6.5 6.0  HGB 12.8* 12.3*  HCT 37.4* 36.6*  PLT 398 403*   BMET Recent Labs    11/30/17 0543 12/01/17 0558  NA 140 137  K 3.4* 3.3*  CL 101 102  CO2 27 23  GLUCOSE 98 83  BUN 12 9  CREATININE 0.87 0.92  CALCIUM 9.0 8.3*   PT/INR No results for input(s): LABPROT, INR in the last 72 hours. ABG No results for input(s): PHART, HCO3 in the last 72 hours.  Invalid input(s): PCO2, PO2  Studies/Results: No results found.  Anti-infectives: Anti-infectives (From admission, onward)   Start     Dose/Rate Route Frequency Ordered Stop   11/30/17 0600  piperacillin-tazobactam (ZOSYN) IVPB 3.375 g     3.375 g 12.5 mL/hr over 240 Minutes Intravenous Every 8 hours 11/29/17 2056     11/28/17 0600  metroNIDAZOLE (FLAGYL) IVPB 500 mg     500 mg 100 mL/hr over 60 Minutes Intravenous Every 8 hours 11/28/17 0216     11/28/17 0300  piperacillin-tazobactam (ZOSYN) IVPB 3.375 g  Status:  Discontinued     3.375 g 12.5 mL/hr over 240 Minutes  Intravenous Every 8 hours 11/28/17 0216 11/29/17 2056   11/27/17 2015  piperacillin-tazobactam (ZOSYN) IVPB 3.375 g     3.375 g 100 mL/hr over 30 Minutes Intravenous  Once 11/27/17 2013 11/27/17 2102   11/27/17 2015  metroNIDAZOLE (FLAGYL) IVPB 500 mg     500 mg 100 mL/hr over 60 Minutes Intravenous  Once 11/27/17 2013 11/27/17 2210      Assessment/Plan:   T2DM - SSI  Acute diverticulitis with concern for developing abscess - CT 4/24: Continued wall thickening of the mid to distal sigmoid colon consistent with ongoing diverticulitis. Increased focal extraluminal gas and fluid collection, consistent with localized perforation and phlegmon/probable developing abscess although no discrete rim enhancing fluid collection seen at this time  -Pain/tenderness resolved this morning -continue IV abx  -Clear liquid diet, advance to full liquids as tolerated -Suggest repeat CT scanning tomorrow to make sure there is no abscess -If CT looks good, could advance to soft diet and consider discharge home on antibiotics Tuesday or Wednesday  Hypokalemia - K 3.4, replace IV  FEN: NPO with ice chips, IVF VTE: SCDs ID: Zosyn/flagyl 4/24>>       LOS: 4 days    Joshua Wall 12/01/2017

## 2017-12-01 NOTE — Progress Notes (Signed)
   Subjective:  Patient evaluated this morning.  States his abdominal pain has almost resolved. States he continued to not require pain medication overnight.  He says he has occasional pressure in the left and right lower quadrants . Reports a bowel movement over night.  He denies fever/chills. States he would like to drink liquids and eat.  Objective:  Vital signs in last 24 hours: Vitals:   11/30/17 0508 11/30/17 1215 11/30/17 2118 12/01/17 0425  BP: 130/75 116/63 126/77 112/71  Pulse: 86 75 61 62  Resp:  14    Temp: 98.2 F (36.8 C) 98.4 F (36.9 C) 98.9 F (37.2 C) 98.4 F (36.9 C)  TempSrc: Oral Oral Oral Oral  SpO2: 98% 98% 98% 98%  Weight:      Height:       Physical Exam  Constitutional: He is well-developed, well-nourished, and in no distress.  Cardiovascular: Normal rate, regular rhythm and normal heart sounds. Exam reveals no gallop and no friction rub.  No murmur heard. Pulmonary/Chest: Effort normal and breath sounds normal. No respiratory distress. He has no wheezes. He has no rales.  Abdominal: Soft. Bowel sounds are normal. He exhibits no distension. There is no tenderness. There is no rebound and no guarding.  Skin: Skin is warm and dry.     Assessment/Plan:  Active Problems:   Diverticulitis  Acute diverticulitis complicated by phlegmon versus developing abscess Patient is on IV Zosyn and metronidazole with improvement in his abdominal pain. He states he had a bowel movement yesterday. He has been afebrile with no leukocytosis. Surgery following.  and noted yesterday they plan on re-scan of abdomen in a couple of days.  Touched base with them today and will follow up recommendations. -Currently NPO for bowel rest per surgery -LR - IV Zosyn and metronidazole - IV dilaudid if needed  Hypokalemia 3.3.  Repleating in LR -bmet in am  Dispo: Anticipated discharge pending continued improvement in condition.   Kalman Shan [place], DO 12/01/2017, 12:37  PM Pager: [phone]

## 2017-12-01 NOTE — Plan of Care (Signed)
Pt receiving IV fluid and abx. NPO at this time for gut rest.

## 2017-12-02 ENCOUNTER — Inpatient Hospital Stay (HOSPITAL_COMMUNITY): Payer: Medicaid Other

## 2017-12-02 LAB — CULTURE, BLOOD (ROUTINE X 2)
Culture: NO GROWTH
Culture: NO GROWTH
Special Requests: ADEQUATE
Special Requests: ADEQUATE

## 2017-12-02 LAB — BASIC METABOLIC PANEL
ANION GAP: 9 (ref 5–15)
BUN: 10 mg/dL (ref 6–20)
CALCIUM: 9 mg/dL (ref 8.9–10.3)
CHLORIDE: 105 mmol/L (ref 101–111)
CO2: 23 mmol/L (ref 22–32)
CREATININE: 1 mg/dL (ref 0.61–1.24)
GFR calc Af Amer: >60 mL/min (ref >60)
GFR calc non Af Amer: >60 mL/min (ref >60)
Glucose, Bld: 89 mg/dL (ref 65–99)
Potassium: 3.8 mmol/L (ref 3.5–5.1)
SODIUM: 137 mmol/L (ref 135–145)

## 2017-12-02 LAB — CBC
HCT: 41.9 % (ref 39.0–52.0)
HEMOGLOBIN: 14.5 g/dL (ref 13.0–17.0)
MCH: 29.8 pg (ref 26.0–34.0)
MCHC: 34.6 g/dL (ref 30.0–36.0)
MCV: 86 fL (ref 78.0–100.0)
PLATELETS: 447 10*3/uL — AB (ref 150–400)
RBC: 4.87 MIL/uL (ref 4.22–5.81)
RDW: 12.9 % (ref 11.5–15.5)
WBC: 6.1 10*3/uL (ref 4.0–10.5)

## 2017-12-02 LAB — GLUCOSE, CAPILLARY
GLUCOSE-CAPILLARY: 94 mg/dL (ref 65–99)
GLUCOSE-CAPILLARY: 108 mg/dL — AB (ref 65–99)
GLUCOSE-CAPILLARY: 91 mg/dL (ref 65–99)
Glucose-Capillary: 88 mg/dL (ref 65–99)

## 2017-12-02 MED ORDER — IOPAMIDOL (ISOVUE-300) INJECTION 61%
30.0000 mL | Freq: Once | INTRAVENOUS | Status: DC | PRN
Start: 2017-12-02 — End: 2017-12-03

## 2017-12-02 MED ORDER — IOPAMIDOL (ISOVUE-300) INJECTION 61%
INTRAVENOUS | Status: AC
  Administered 2017-12-02: 100 mL
  Filled 2017-12-02: qty 30

## 2017-12-02 NOTE — Progress Notes (Signed)
   Subjective: No acute events overnight, again did not require IV agents for pain control.  Has been having regular bowel movements and tolerating clear liquid diet.   Objective:  Vital signs in last 24 hours: Vitals:   11/30/17 2118 12/01/17 0425 12/01/17 1359 12/01/17 2137  BP: 126/77 112/71 123/78 121/76  Pulse: 61 62 63 60  Resp:   20 16  Temp: 98.9 F (37.2 C) 98.4 F (36.9 C) 98.8 F (37.1 C) 98.1 F (36.7 C)  TempSrc: Oral Oral Oral Oral  SpO2: 98% 98% 99% 98%  Weight:      Height:       General: Resting in bed comfortably  CV: RRR, no murmur appreciated  Resp: Normal work of breathing, no distress  Abd: Obese, +BS, no TTP to lower abdomen  Extr: No LE edema  Neuro: Alert and oriented x3  Skin: Warm, dry      Assessment/Plan:  Acute Diverticulitis, ? Phlegmon v Developing Abscess   Patient presented with increased abdominal pain following discharge for acute diverticulitis. Repeat CT showed ongoing diverticulitis with a gas/fluid collection concerning for a phlegmon or developing abscess (no ring-enhancing definitive abscess at the time), restarted on IV antibiotics.  Has been afebrile, no leukocytosis, pain has improved and tolerating liquid diet. --Appreciate surgery assistance  --Advance to full liquid diet --Rpt CT today per surgery recommendations  --Cont Zosyn, DC metronidazole (redundant coverage) --Pain control: Tylenol    Dispo: Anticipated discharge pending clinical improvement  Tawny Asal, MD 12/02/2017, 10:25 AM Pager: [phone]

## 2017-12-02 NOTE — Progress Notes (Signed)
[facility] Progress Note     Subjective: CC: diverticulitis Patient with no abdominal pain this AM. Denies n/v. Tolerating FLD and having bowel function. Discussed pathology of diverticulitis some and answered questions about this. Patient to go for CT scan today.   Objective: Vital signs in last 24 hours: Temp:  [98.1 F (36.7 C)-98.8 F (37.1 C)] 98.1 F (36.7 C) (04/28 2137) Pulse Rate:  [60-63] 60 (04/28 2137) Resp:  [16-20] 16 (04/28 2137) BP: (121-123)/(76-78) 121/76 (04/28 2137) SpO2:  [98 %-99 %] 98 % (04/28 2137) Last BM Date: 11/30/17  Intake/Output from previous day: 04/28 0701 - 04/29 0700 In: 650 [P.O.:200; IV Piggyback:450] Out: -  Intake/Output this shift: No intake/output data recorded.  PE: Gen:  Alert, NAD, pleasant Card:  Regular rate and rhythm, pedal pulses 2+ BL Pulm:  Normal effort, clear to auscultation bilaterally Abd: Soft, non-tender, non-distended, bowel sounds present  Skin: warm and dry, no rashes  Psych: A&Ox3   Lab Results:  Recent Labs    12/01/17 0558 12/02/17 0617  WBC 6.0 6.1  HGB 12.3* 14.5  HCT 36.6* 41.9  PLT 403* 447*   BMET Recent Labs    12/01/17 0558 12/02/17 0617  NA 137 137  K 3.3* 3.8  CL 102 105  CO2 23 23  GLUCOSE 83 89  BUN 9 10  CREATININE 0.92 1.00  CALCIUM 8.3* 9.0   PT/INR No results for input(s): LABPROT, INR in the last 72 hours. CMP     Component Value Date/Time   NA 137 12/02/2017 0617   K 3.8 12/02/2017 0617   CL 105 12/02/2017 0617   CO2 23 12/02/2017 0617   GLUCOSE 89 12/02/2017 0617   BUN 10 12/02/2017 0617   CREATININE 1.00 12/02/2017 0617   CALCIUM 9.0 12/02/2017 0617   PROT 7.9 11/27/2017 1214   ALBUMIN 3.9 11/27/2017 1214   AST 17 11/27/2017 1214   ALT 23 11/27/2017 1214   ALKPHOS 60 11/27/2017 1214   BILITOT 0.5 11/27/2017 1214   GFRNONAA >60 12/02/2017 0617   GFRAA >60 12/02/2017 0617   Lipase     Component Value Date/Time   LIPASE 23 11/27/2017 1214        Studies/Results: No results found.  Anti-infectives: Anti-infectives (From admission, onward)   Start     Dose/Rate Route Frequency Ordered Stop   11/30/17 0600  piperacillin-tazobactam (ZOSYN) IVPB 3.375 g     3.375 g 12.5 mL/hr over 240 Minutes Intravenous Every 8 hours 11/29/17 2056     11/28/17 0600  metroNIDAZOLE (FLAGYL) IVPB 500 mg  Status:  Discontinued     500 mg 100 mL/hr over 60 Minutes Intravenous Every 8 hours 11/28/17 0216 12/02/17 0850   11/28/17 0300  piperacillin-tazobactam (ZOSYN) IVPB 3.375 g  Status:  Discontinued     3.375 g 12.5 mL/hr over 240 Minutes Intravenous Every 8 hours 11/28/17 0216 11/29/17 2056   11/27/17 2015  piperacillin-tazobactam (ZOSYN) IVPB 3.375 g     3.375 g 100 mL/hr over 30 Minutes Intravenous  Once 11/27/17 2013 11/27/17 2102   11/27/17 2015  metroNIDAZOLE (FLAGYL) IVPB 500 mg     500 mg 100 mL/hr over 60 Minutes Intravenous  Once 11/27/17 2013 11/27/17 2210       Assessment/Plan T2DM - SSI  Acute diverticulitis with concern for developing abscess - CT 4/24: Continued wall thickening of the mid to distal sigmoid colon consistent with ongoing diverticulitis. Increased focal extraluminal gas and fluid collection, consistent with localized perforation  and phlegmon/probable developing abscess although no discrete rim enhancing fluid collection seen at this time -Pain/tenderness resolved this morning -continue IV abx  - tolerating FLD, may advance to soft diet if CT looks good.  -Repeat CT today   Hypokalemia- K 3.8, improved  FEN: FLD VTE: SCDs ID: Zosyn/flagyl 4/24>>    LOS: 5 days    Brigid Re , [facility] 12/02/2017, 10:55 AM Pager: [phone] Consults: [phone] Mon-Fri 7:00 am-4:30 pm Sat-Sun 7:00 am-11:30 am

## 2017-12-02 NOTE — Plan of Care (Signed)
  Problem: Nutrition: Goal: Adequate nutrition will be maintained Outcome: Progressing   Problem: Health Behavior/Discharge Planning: Goal: Ability to manage health-related needs will improve Outcome: Progressing

## 2017-12-03 DIAGNOSIS — K5720 Diverticulitis of large intestine with perforation and abscess without bleeding: Principal | ICD-10-CM

## 2017-12-03 LAB — GLUCOSE, CAPILLARY
GLUCOSE-CAPILLARY: 153 mg/dL — AB (ref 65–99)
Glucose-Capillary: 95 mg/dL (ref 65–99)

## 2017-12-03 MED ORDER — AMOXICILLIN-POT CLAVULANATE 875-125 MG PO TABS: 1.0000 | ORAL_TABLET | Freq: Two times a day (BID) | ORAL | 0 refills | Status: AC

## 2017-12-03 NOTE — Progress Notes (Addendum)
   Subjective: No acute events overnight. Some soft stools, no nausea, no abdominal pain. Did not require IV agents for pain control. Tolerating soft diet well.    Objective:  Vital signs in last 24 hours: Vitals:   12/01/17 2137 12/02/17 1541 12/02/17 2100 12/03/17 0624  BP: 121/76 133/72 125/81 106/87  Pulse: 60 74 77 77  Resp: 16  18 17   Temp: 98.1 F (36.7 C) 98 F (36.7 C) 98.8 F (37.1 C) 97.9 F (36.6 C)  TempSrc: Oral Oral Oral Oral  SpO2: 98% 98% 98% 98%  Weight:      Height:       General: Afebrile. Awake, alert, oriented x 3. Patient in no acute distress. Lying comfortably in bed.  CV: RRR, S1 and S2 heard. No murmurs, gallops or rubs. No JVDs.   Resp: Clear to auscultation. No use of accessory muscles. No crackles, wheezing or rhonchi.   Abd: Obese, soft, nontender to palpation, no guarding, nondistended. Positive BS x 4 quadrants. No organomegaly. Extr: No cyanosis, clubbing or edema. Pulses full throughout.  Neuro: Alert and oriented x 3. CN 2-12 grossly intact.   Skin: Warm, dry Psych: No hallucinations. Mood and affect appropriate.      Assessment/Plan:  Acute Diverticulitis, ? Phlegmon v Developing Abscess   Patient presented with increased abdominal pain following discharge for acute diverticulitis. CT abdomen on admisison showed ongoing diverticulitis on the sigmoid colon with concern for developing abscess. Repeat CT 4/29 showed decrease in size of the abscess collection within the sigmoid mesentery. Has been afebrile, no leukocytosis, pain has improved and tolerating soft diet well. Stable for discharge today with plan for continued PO antibiotics with augmentin, repeat CT and colonoscopy as an outpatient.   --Appreciate surgery assistance with management and discharge plan  --Continue soft diet  --Cont Zosyn, transition to PO Augmentin on discharge   --Pain control: Tylenol    Dispo: Anticipated discharge today.    Velna Ochs, MD 12/03/2017,  9:35 AM Pager: [phone]

## 2017-12-03 NOTE — Progress Notes (Signed)
[facility] Progress Note     Subjective: CC: diverticulitis No pain. Tolerating diet, having bowel function. Discussed follow up and repeat CT in about 2 weeks.  UOP good. VSS.   Objective: Vital signs in last 24 hours: Temp:  [97.9 F (36.6 C)-98.8 F (37.1 C)] 97.9 F (36.6 C) (04/30 0624) Pulse Rate:  [74-77] 77 (04/30 0624) Resp:  [17-18] 17 (04/30 0624) BP: (106-133)/(72-87) 106/87 (04/30 0624) SpO2:  [98 %] 98 % (04/30 0624) Last BM Date: 11/30/17  Intake/Output from previous day: No intake/output data recorded. Intake/Output this shift: Total I/O In: 240 [P.O.:240] Out: -   PE: Gen:  Alert, NAD, pleasant Card:  Regular rate and rhythm, pedal pulses 2+ BL Pulm:  Normal effort, clear to auscultation bilaterally Abd: Soft, non-tender, non-distended, bowel sounds present Skin: warm and dry, no rashes  Psych: A&Ox3   Lab Results:  Recent Labs    12/01/17 0558 12/02/17 0617  WBC 6.0 6.1  HGB 12.3* 14.5  HCT 36.6* 41.9  PLT 403* 447*   BMET Recent Labs    12/01/17 0558 12/02/17 0617  NA 137 137  K 3.3* 3.8  CL 102 105  CO2 23 23  GLUCOSE 83 89  BUN 9 10  CREATININE 0.92 1.00  CALCIUM 8.3* 9.0   PT/INR No results for input(s): LABPROT, INR in the last 72 hours. CMP     Component Value Date/Time   NA 137 12/02/2017 0617   K 3.8 12/02/2017 0617   CL 105 12/02/2017 0617   CO2 23 12/02/2017 0617   GLUCOSE 89 12/02/2017 0617   BUN 10 12/02/2017 0617   CREATININE 1.00 12/02/2017 0617   CALCIUM 9.0 12/02/2017 0617   PROT 7.9 11/27/2017 1214   ALBUMIN 3.9 11/27/2017 1214   AST 17 11/27/2017 1214   ALT 23 11/27/2017 1214   ALKPHOS 60 11/27/2017 1214   BILITOT 0.5 11/27/2017 1214   GFRNONAA >60 12/02/2017 0617   GFRAA >60 12/02/2017 0617   Lipase     Component Value Date/Time   LIPASE 23 11/27/2017 1214       Studies/Results: Ct Abdomen Pelvis W Contrast  Result Date: 12/02/2017 CLINICAL DATA:  32 year old male. Decrease  in abdominal pain. Regular bowel movements and tolerating clear liquid diet. Afebrile without leukocytosis. Follow-up diverticulitis. Subsequent encounter. EXAM: CT ABDOMEN AND PELVIS WITH CONTRAST TECHNIQUE: Multidetector CT imaging of the abdomen and pelvis was performed using the standard protocol following bolus administration of intravenous contrast. CONTRAST:  150mL ISOVUE-300 IOPAMIDOL (ISOVUE-300) INJECTION 61% COMPARISON:  11/28/2017 abdominal series. 11/27/2017 and 11/21/2017 CT. FINDINGS: Lower chest: Lung bases clear.  Heart size within normal limits. Hepatobiliary: No worrisome hepatic lesion. No calcified gallstone or common bile duct stone. Pancreas: No pancreatic mass or inflammation. Spleen: Top normal size spleen without focal lesion. Adrenals/Urinary Tract: No obstructing stone or hydronephrosis. No renal or adrenal mass. Noncontrast filled views of the urinary bladder unremarkable. Stomach/Bowel: Diverticulitis sigmoid colon. Perforation into adjacent mesentery. When compared to the 11/27/2017 examination, there has been a decrease in size of the abscess collection within the sigmoid mesentery. Residual abscess currently measures 2 x 3.1 x 3 cm and previously measured 4 x 5 x 5.2 cm. Surrounding inflammation has also decreased slightly in size. No inflammation surrounds the appendix. No new bowel abnormality detected. Vascular/Lymphatic: No aortic aneurysm or large vessel occlusion. Small lymph nodes within the pelvis possibly reactive in origin. Reproductive: Negative Other: No bowel containing hernia. Tiny periumbilical hernia. Injection sites anterior abdominal/pelvic wall.  Musculoskeletal: No worrisome abnormality. IMPRESSION: Diverticulitis sigmoid colon. Perforation into adjacent mesentery. When compared to the 11/27/2017 examination, there has been a decrease in size of the abscess collection within the sigmoid mesentery. Residual abscess currently measures 2 x 3.1 x 3 cm and previously  measured 4 x 5 x 5.2 cm. Slightly enlarged fatty liver spanning over 19.7 cm. Electronically Signed   By: Genia Del M.D.   On: 12/02/2017 17:15    Anti-infectives: Anti-infectives (From admission, onward)   Start     Dose/Rate Route Frequency Ordered Stop   11/30/17 0600  piperacillin-tazobactam (ZOSYN) IVPB 3.375 g     3.375 g 12.5 mL/hr over 240 Minutes Intravenous Every 8 hours 11/29/17 2056     11/28/17 0600  metroNIDAZOLE (FLAGYL) IVPB 500 mg  Status:  Discontinued     500 mg 100 mL/hr over 60 Minutes Intravenous Every 8 hours 11/28/17 0216 12/02/17 0850   11/28/17 0300  piperacillin-tazobactam (ZOSYN) IVPB 3.375 g  Status:  Discontinued     3.375 g 12.5 mL/hr over 240 Minutes Intravenous Every 8 hours 11/28/17 0216 11/29/17 2056   11/27/17 2015  piperacillin-tazobactam (ZOSYN) IVPB 3.375 g     3.375 g 100 mL/hr over 30 Minutes Intravenous  Once 11/27/17 2013 11/27/17 2102   11/27/17 2015  metroNIDAZOLE (FLAGYL) IVPB 500 mg     500 mg 100 mL/hr over 60 Minutes Intravenous  Once 11/27/17 2013 11/27/17 2210       Assessment/Plan T2DM - per primary service  Acute diverticulitis with concern for developing abscess - CT 4/24: diverticulitis with phlegmon -Pain/tendernesscompletely resolved - repeat CT 4/29: decrease in size of phlegmon, improving - SOFT diet - ok to d/c on soft diet and another 2 weeks of  PO augmentin with outpatient CT in 2 weeks - will need colonoscopy in 6-8 weeks, surgical follow up after colonoscopy    FEN: soft diet VTE: SCDs ID: Zosyn/flagyl 4/24>>    LOS: 6 days    Brigid Re , [facility] 12/03/2017, 9:39 AM Pager: [phone] Consults: [phone] Mon-Fri 7:00 am-4:30 pm Sat-Sun 7:00 am-11:30 am

## 2017-12-03 NOTE — Discharge Instructions (Signed)
Diverticulitis (Diverticulitis) La diverticulitis ocurre cuando pequeos bolsillos que se han formado en el colon (intestino grueso) se infectan o se inflaman. CUIDADOS EN EL HOGAR  Siga las indicaciones del mdico.  Siga una dieta especial si el mdico se lo indic.  Cuando se sienta mejor, el mdico puede indicarle que cambie la dieta. Tal vez le indiquen que coma gran cantidad de [place]. Las frutas y los vegetales son buenas fuentes de [place]. La fibra facilita la evacuacin intestinal (defecacin).  Tome los suplementos o los probiticos como le indic el [place] los medicamentos solamente como se lo haya indicado el mdico.  Cumpla con todas las visitas de control con su mdico.  SOLICITE AYUDA SI:  El dolor no mejora.  Le resulta difcil alimentarse.  No defeca como lo hace normalmente.  SOLICITE AYUDA DE INMEDIATO SI:  El dolor empeora.  Los problemas no mejoran.  Los problemas empeoran repentinamente.  Tiene fiebre.  No deja de vomitar.  La materia fecal (heces) es sanguinolenta o negra, de aspecto alquitranado.  ASEGRESE DE QUE:  Comprende estas instrucciones.  Controlar su afeccin.  Recibir ayuda de inmediato si no mejora o si empeora.  Esta informacin no tiene Marine scientist el consejo del mdico. Asegrese de hacerle al mdico cualquier pregunta que tenga. Document Released: 07/12/2011 Document Revised: 07/28/2013 Document Reviewed: 06/17/2013 Elsevier Interactive Patient Education  2017 [place] de alimentacin blanda (Soft-Food Meal Plan) Un plan de alimentacin blanda incluye alimentos seguros y fciles de tragar. Este plan de alimentacin suele usarse en los siguientes casos:  Si tiene problemas para Engineer, manufacturing systems o tragar alimentos.  Como transicin despus de haber tenido un plan de alimentacin lquida durante un perodo prolongado. QU DEBO SABER ACERCA DEL PLAN DE ALIMENTACIN BLANDA? Un plan de alimentacin blanda  incluye alimentos tiernos que son seguros y fciles de Engineer, manufacturing systems y Chartered loss adjuster. En la [employer], es ms fcil tragar los alimentos en bocados. Un bocado mide aproximadamente media pulgada o menos. Los alimentos de [place] [employer] plan no necesitan molerse ni Pharmacologist. Deben evitarse los alimentos muy duros, crujientes o pegajosos. Adems, tambin deben evitarse los panes, cereales, yogures y postres con nueces, semillas o frutas. QU ALIMENTOS PUEDO COMER? Cereales Arroz blanco y arroz integral. Pan humedecido, aderezos, pastas y fideos. Cereales cocidos o secos bien humedecidos, como la smola (trigo cocido), avena o polenta. Los bizcochos, panes, magdalenas, panqueques y waffles deben estar bien humedecidos. Vegetales [place] cortada [employer]. Vegetales tiernos cocidos, incluida la papa sin piel. Jugos de vegetales. Caldos o sopas cremosas preparadas con vegetales que no sean fibrosos o gomosos. Tomates colados (sin semillas). Frutas Frutas enlatadas o bien cocidas. Frutas frescas blandas (maduras) y peladas, como duraznos, pelones, kiwi, meln cantalupo, meln roco de miel y sanda (sin semillas). Frutos secos blandos con semillas pequeas, como las frutillas. Jugos de frutas (sin pulpa). Carnes y [place] fuentes de protenas Carne [place] tierna y [place]. Carnero. Cordero. Ternera. Pollo. Pavo. Hgado. Jamn. Pescado sin espinas. Huevos. [employer], bebidas a base de [place] y crema. Queso crema y requesn. Yogur natural. Dulces/postres Gelatina saborizada. Natillas. Helado natural, yogur helado, sorbetes, batidos y [place]. Bizcochuelos y Museum/gallery exhibitions officer. Caramelos duros comunes. Otros [place], margarina (sin grasas trans) y aceites para cocinar. [place]. Salsas a base de crema. Condimentos no picantes, sal y azcar. Bruna Potter, melazas, miel y [place]. Esta no es [employer] de los alimentos o las bebidas recomendados. Consulte a su nutricionista para conocer ms opciones. QU ALIMENTOS  NO  ESTN RECOMENDADOS? Cereales [place], tostadas, galletas de agua sin humedecer. Cereales gruesos o secos, como salvado, granola y trigo triturado. Panes duros o gomosos con corteza como pan francs o baguettes. Vegetales Maz. Vegetales crudos, excepto la lechuga cortada [employer]. Vegetales cocidos fibrosos o duros. Papas duras, fritas, crocantes o con piel. Lambert Mody Frutas frescas con piel o semillas o ambas, como manzanas, peras o ciruelas. Lambert Mody fibrosas y con mucha pulpa, como la papaya, la pia, el coco o el mango. Frutas deshidratadas, rollitos sabor a fruta y [employer] frutos secos. Carnes y otras fuentes de protenas [place] y perros calientes. Carnes con cartlago. Pescado con espinas. Nueces, semillas y [place] de man o nuez espesa. Dulces/postres Bizcochuelos o Primary school teacher secos o gomosos. Esta no es [employer] de los alimentos y las bebidas que Nurse, adult. Consulte a su nutricionista para obtener ms informacin. Esta informacin no tiene Marine scientist el consejo del mdico. Asegrese de hacerle al mdico cualquier pregunta que tenga. Document Released: 07/05/2008 Document Revised: 07/28/2013 Document Reviewed: 06/19/2013 Elsevier Interactive Patient Education  2017 [employer].

## 2017-12-03 NOTE — Progress Notes (Signed)
Pt verbalized understanding of medication administration and follow up appointments. Pt verbalized understanding of when to return in case of emergency. Pt and spouse without any questions or concerns at this time.

## 2017-12-06 ENCOUNTER — Encounter: Payer: Self-pay | Admitting: Gastroenterology

## 2017-12-07 NOTE — Discharge Summary (Signed)
Name: Joshua Wall MRN: [id number] DOB: 1985-12-18 32 y.o. PCP: Katherine Roan, MD  Date of Admission: 11/27/2017 12:02 PM Date of Discharge: 12/03/2017 Attending Physician: Dr. Aldine Contes  Discharge Diagnosis: Active Problems:   Diverticulitis   Discharge Medications: Allergies as of 12/03/2017   No Known Allergies     Medication List    STOP taking these medications   ciprofloxacin 500 MG tablet Commonly known as:  CIPRO   freestyle lancets   Lactulose 20 GM/30ML Soln   metroNIDAZOLE 500 MG tablet Commonly known as:  FLAGYL   traMADol 50 MG tablet Commonly known as:  ULTRAM     TAKE these medications   amoxicillin-clavulanate 875-125 MG tablet Commonly known as:  AUGMENTIN Take 1 tablet by mouth every 12 (twelve) hours for 14 days.   metFORMIN 1000 MG tablet Commonly known as:  GLUCOPHAGE Take 1 tablet (1,000 mg total) by mouth 2 (two) times daily with a meal.   polyethylene glycol packet Commonly known as:  MIRALAX Take 17 g by mouth daily.   psyllium 58.6 % powder Commonly known as:  METAMUCIL Take 1 packet by mouth daily.       Disposition and follow-up:   Joshua Wall was [facility] in Stable condition.  At the hospital follow up visit please address:  1.  -Assess for continued resolution of abdominal pain -Ensure adherence to outpatient antibiotic regimen--Augmentin x 2 weeks from discharge  -Encourage to keep follow up for plan of repeat CT scan in 2 weeks, colonoscopy in 6-8 weeks, and surgery follow up.   2.  Labs / imaging needed at time of follow-up: Rpt CT scan  3.  Pending labs/ test needing follow-up: None   Follow-up Appointments: Follow-up Information    Clovis Riley, MD. Call.   Specialty:  General Surgery Why:  Call and schedule a follow up appointment with Dr. Kae Heller to discuss surgery in 8-10 weeks.  Contact information: [address] [place] [address] [phone]        [facility] Follow up.   Specialty:  Gastroenterology Why:  Call and schedule an appointment to schedule colonoscopy in 6-8 weeks. Contact information: [place] [phone] [place] [facility] Course by problem list:   Acute Diverticulitis, Concern for Abscess Patient presented with increased abdominal pain following recent hospitalization from 4/17-4/21 for acute diverticulitis.  During this previous discharge, he was treated with several days of IV antibiotics before transitioning to oral Cipro and metronidazole plan for extended course.  He returned to the hospital with increased abdominal pain, repeat CT scan showed ongoing diverticulitis of the sigmoid colon with concern for developing abscess/phlegmon surrounding this area.  Surgery was consulted and assisted in management.  He was restarted on IV antibiotics with Zosyn, he did not have fever or leukocytosis.  His pain improved with antibiotics and pain control.  Repeat CT scan 4/29 showed decrease in size of fluid collection compared to prior.  He was discharged on a 2-week course of p.o. Augmentin with plan for repeat CT scan in 2 weeks, as well as outpatient colonoscopy in 6-8 weeks with GI, and outpatient follow-up with surgery.  Discharge Vitals:   BP 106/87 (BP Location: Left Arm)   Pulse 77   Temp 97.9 F (36.6 C) (Oral)   Resp 17   Ht 5\' 10"  (1.778 m)   Wt 246 lb (111.6  kg)   SpO2 98%   BMI 35.30 kg/m   Pertinent Labs, Studies, and Procedures:  CT 4/24: Continued wall thickening of the mid to distal sigmoid colon consistent with ongoing diverticulitis. Increased focal extraluminal gas and fluid collection, consistent with localized perforation and phlegmon/probable developing abscess although no discrete rim enhancing fluid collection seen at this time. Hepatic steatosis  CT 4/29: Diverticulitis sigmoid colon.  Perforation into adjacent mesentery. When compared to the 11/27/2017 examination, there has been a decrease in size of the abscess collection within the sigmoid mesentery. Residual abscess currently measures 2 x 3.1 x 3 cm and previously measured 4 x 5 x 5.2 cm   Discharge Instructions: Discharge Instructions    Ambulatory referral to Gastroenterology   Complete by:  As directed    Colonoscopy following acute diverticulitis per surgery. Recommended to be performed in 6-8 weeks (around 6/15)   What is the reason for referral?:  Colonoscopy   Discharge instructions   Complete by:  As directed    Good to see Mr. Busler - Continue taking an antibiotic called Augmentin for 2 more weeks.  He will take this twice a day until May 14 - The surgery team recommended to have a repeat scan also in about 2 weeks to see how the area is doing. - Later, in 6 to 8 weeks we also recommend a colonoscopy to look at the inside of your colon that is not seen as well on a scan - You will then follow up with the surgery team in their clinic after these tests are done      Signed: Tawny Asal, MD 12/07/2017, 3:23 PM   Pager: [phone]

## 2017-12-24 ENCOUNTER — Encounter (HOSPITAL_COMMUNITY): Payer: Self-pay

## 2017-12-24 ENCOUNTER — Ambulatory Visit (HOSPITAL_COMMUNITY)
Admission: RE | Admit: 2017-12-24 | Discharge: 2017-12-24 | Disposition: A | Discharge status: Home / Self Care | Payer: Self-pay | Source: Ambulatory Visit | Attending: Physician Assistant | Admitting: Physician Assistant

## 2017-12-24 DIAGNOSIS — K5730 Diverticulosis of large intestine without perforation or abscess without bleeding: Secondary | ICD-10-CM | POA: Insufficient documentation

## 2017-12-24 DIAGNOSIS — K5780 Diverticulitis of intestine, part unspecified, with perforation and abscess without bleeding: Secondary | ICD-10-CM

## 2017-12-24 DIAGNOSIS — K760 Fatty (change of) liver, not elsewhere classified: Secondary | ICD-10-CM | POA: Insufficient documentation

## 2017-12-24 DIAGNOSIS — K439 Ventral hernia without obstruction or gangrene: Secondary | ICD-10-CM | POA: Insufficient documentation

## 2017-12-24 DIAGNOSIS — K5792 Diverticulitis of intestine, part unspecified, without perforation or abscess without bleeding: Secondary | ICD-10-CM

## 2017-12-24 MED ORDER — IOHEXOL 300 MG/ML  SOLN
100.0000 mL | Freq: Once | INTRAMUSCULAR | Status: AC | PRN
  Administered 2017-12-24: 100 mL via INTRAVENOUS

## 2018-02-18 ENCOUNTER — Ambulatory Visit: Payer: Self-pay | Admitting: Gastroenterology

## 2018-03-18 ENCOUNTER — Ambulatory Visit: Payer: Self-pay

## 2018-03-18 ENCOUNTER — Encounter (INDEPENDENT_AMBULATORY_CARE_PROVIDER_SITE_OTHER): Payer: Self-pay

## 2018-03-31 ENCOUNTER — Ambulatory Visit: Payer: Self-pay | Admitting: Internal Medicine

## 2018-03-31 ENCOUNTER — Encounter: Payer: Self-pay | Admitting: Internal Medicine

## 2018-03-31 ENCOUNTER — Other Ambulatory Visit: Payer: Self-pay

## 2018-03-31 VITALS — BP 140/89 | HR 72 | Temp 98.2°F | Ht 68.5 in | Wt 255.8 lb

## 2018-03-31 DIAGNOSIS — B351 Tinea unguium: Secondary | ICD-10-CM

## 2018-03-31 DIAGNOSIS — I10 Essential (primary) hypertension: Secondary | ICD-10-CM

## 2018-03-31 DIAGNOSIS — Z79899 Other long term (current) drug therapy: Secondary | ICD-10-CM

## 2018-03-31 DIAGNOSIS — Z8719 Personal history of other diseases of the digestive system: Secondary | ICD-10-CM

## 2018-03-31 DIAGNOSIS — E119 Type 2 diabetes mellitus without complications: Secondary | ICD-10-CM

## 2018-03-31 DIAGNOSIS — Z23 Encounter for immunization: Secondary | ICD-10-CM

## 2018-03-31 DIAGNOSIS — Z7984 Long term (current) use of oral hypoglycemic drugs: Secondary | ICD-10-CM

## 2018-03-31 DIAGNOSIS — Z87891 Personal history of nicotine dependence: Secondary | ICD-10-CM

## 2018-03-31 LAB — GLUCOSE, CAPILLARY: Glucose-Capillary: 125 mg/dL — ABNORMAL HIGH (ref 70–99)

## 2018-03-31 LAB — POCT GLYCOSYLATED HEMOGLOBIN (HGB A1C): Hemoglobin A1C: 6 % — AB (ref 4.0–5.6)

## 2018-03-31 MED ORDER — LOSARTAN POTASSIUM 25 MG PO TABS: 25.0000 mg | ORAL_TABLET | Freq: Every day | ORAL | 2 refills | Status: DC

## 2018-03-31 MED ORDER — TERBINAFINE HCL 250 MG PO TABS: 250.0000 mg | ORAL_TABLET | Freq: Every day | ORAL | 0 refills | Status: AC

## 2018-03-31 NOTE — Progress Notes (Signed)
   CC: Follow up of T2DM, HTN, toenail fungus, hx of diverticulitis,   HPI:  [name] is a 32 y.o.  Male with PMH below.  He is here to address T2DM, HTN, toenail fungus, hx of diverticulitis.    Please see A&P for status of the patient's chronic medical conditions  Past Medical History:  Diagnosis Date  . Acute diverticulitis 11/2017  . Diabetes mellitus without complication ([place])    Review of Systems:  ROS: Pulmonary: pt denies increased work of breathing, shortness of breath,  Cardiac: pt denies palpitations, chest pain,  Abdominal: pt denies abdominal pain, nausea, vomiting, or diarrhea  Physical Exam:  Vitals:   03/31/18 1542  BP: 140/89  Pulse: 72  Temp: 98.2 F (36.8 C)  TempSrc: Oral  SpO2: 99%  Weight: 255 lb 12.8 oz (116 kg)  Height: 5' 8.5" (1.74 m)   Cardiac: normal rate and rhythm, clear s1 and s2 Pulmonary: CTAB, not in distress Abdominal: non distended abdomen, soft and nontender Extremities: no LE edema Mental status: Alert, conversant, in good spirits  Social History   Socioeconomic History  . Marital status: Married    Spouse name: Not on file  . Number of children: Not on file  . Years of education: Not on file  . Highest education level: Not on file  Occupational History  . Not on file  Social Needs  . Financial resource strain: Not on file  . Food insecurity:    Worry: Not on file    Inability: Not on file  . Transportation needs:    Medical: Not on file    Non-medical: Not on file  Tobacco Use  . Smoking status: Former Smoker    Types: Cigarettes  . Smokeless tobacco: Never Used  . Tobacco comment: 3 cigarettes per week.  Substance and Sexual Activity  . Alcohol use: Yes    Comment: Sometimes.  . Drug use: No  . Sexual activity: Not on file  Lifestyle  . Physical activity:    Days per week: Not on file    Minutes per session: Not on file  . Stress: Not on file  Relationships  . Social connections:    Talks on  phone: Not on file    Gets together: Not on file    Attends religious service: Not on file    Active member of club or organization: Not on file    Attends meetings of clubs or organizations: Not on file    Relationship status: Not on file  . Intimate partner violence:    Fear of current or ex partner: Not on file    Emotionally abused: Not on file    Physically abused: Not on file    Forced sexual activity: Not on file  Other Topics Concern  . Not on file  Social History Narrative  . Not on file    No family history on file.  Assessment & Plan:   See Encounters Tab for problem based charting.  Patient discussed with Dr. Rebeca Alert

## 2018-03-31 NOTE — Patient Instructions (Addendum)
Joshua Wall we have started you on a medication called losartan.  This will help control your blood pressure and protect your kidneys.  I have also restarted you on a medication called terbinafine to help with your toenail fungus.  Please return in one month for repeat labs to check your liver and renal function.

## 2018-04-01 LAB — MICROALBUMIN / CREATININE URINE RATIO
Creatinine, Urine: 125.8 mg/dL
MICROALBUM., U, RANDOM: 4.8 ug/mL
Microalb/Creat Ratio: 3.8 mg/g creat (ref 0.0–30.0)

## 2018-04-01 NOTE — Assessment & Plan Note (Addendum)
This is a chronic issue for the patient and I saw him for this back later last year in November, I started him on oral terbinafine and he took it for several weeks and it was clearing up however he developed diverticulitis and came off of the medication.  He wishes to give this another try.  His baseline LFTs are normal.  I reiterated to him the importance of keeping the feet clean and dry using [employer] powders ETC.    -I will refill terbinafine to 50 mg for 12 weeks starting over his course and we will get a repeat LFT in about a month to 6 weeks.

## 2018-04-01 NOTE — Assessment & Plan Note (Signed)
Blood pressure elevated today 140/89.  Historically has not been hypertensive but last few readings have been on the higher side of normotensive, also with his diabetes there is indication for an ACE or are and he has a history of proteinuria though none seen on his urine studies today.  -We will add losartan 25 mg daily to regimen repeat BMP in 1 month

## 2018-04-01 NOTE — Assessment & Plan Note (Signed)
Lab Results  Component Value Date   HGBA1C 6.0 (A) 03/31/2018   A1c today is excellent, continue metformin 1000 mg twice daily.  I will assess the patient for proteinuria today with urine microalbumin creatinine ratio which returned much improved from his prior he is now within the normal range.  -As his blood pressure is elevated I will however start losartan 25 mg daily -Repeat BMP at his next visit

## 2018-04-01 NOTE — Assessment & Plan Note (Signed)
Patient has been doing well since his hospitalization for diverticulitis, he reports a normal soft bowel movement daily and has been benefiting from the Metamucil that he takes regularly.  Patient is still not have orange card eligibility fully in place but is working on it turned in his forearms recently.    -His wife will call me and let me know when his orange card is instated.  I will then work to get him a colonoscopy to complete his evaluation.

## 2018-04-10 NOTE — Progress Notes (Signed)
Internal Medicine Clinic Attending  Case discussed with Dr. Winfrey at the time of the visit.  We reviewed the resident's history and exam and pertinent patient test results.  I agree with the assessment, diagnosis, and plan of care documented in the resident's note.  Alexander Raines, M.D., Ph.D.  

## 2018-04-15 ENCOUNTER — Ambulatory Visit: Payer: Self-pay | Admitting: Gastroenterology

## 2018-04-24 ENCOUNTER — Telehealth: Payer: Self-pay | Admitting: Internal Medicine

## 2018-04-28 ENCOUNTER — Ambulatory Visit (INDEPENDENT_AMBULATORY_CARE_PROVIDER_SITE_OTHER): Payer: Self-pay | Admitting: Internal Medicine

## 2018-04-28 ENCOUNTER — Other Ambulatory Visit: Payer: Self-pay

## 2018-04-28 ENCOUNTER — Encounter: Payer: Self-pay | Admitting: Internal Medicine

## 2018-04-28 VITALS — BP 131/80 | HR 71 | Temp 98.2°F | Ht 68.0 in | Wt 259.6 lb

## 2018-04-28 DIAGNOSIS — B351 Tinea unguium: Secondary | ICD-10-CM

## 2018-04-28 DIAGNOSIS — H5713 Ocular pain, bilateral: Secondary | ICD-10-CM

## 2018-04-28 DIAGNOSIS — I10 Essential (primary) hypertension: Secondary | ICD-10-CM

## 2018-04-28 DIAGNOSIS — Z79899 Other long term (current) drug therapy: Secondary | ICD-10-CM

## 2018-04-28 DIAGNOSIS — Z8719 Personal history of other diseases of the digestive system: Secondary | ICD-10-CM

## 2018-04-28 DIAGNOSIS — E119 Type 2 diabetes mellitus without complications: Secondary | ICD-10-CM

## 2018-04-28 DIAGNOSIS — Z7984 Long term (current) use of oral hypoglycemic drugs: Secondary | ICD-10-CM

## 2018-04-28 MED ORDER — OLOPATADINE HCL 0.1 % OP SOLN: 1.0000 [drp] | Freq: Two times a day (BID) | OPHTHALMIC | 1 refills | Status: DC

## 2018-04-28 MED ORDER — OLOPATADINE HCL 0.2 % OP SOLN: 1.0000 [drp] | Freq: Every day | OPHTHALMIC | 1 refills | Status: DC

## 2018-04-28 NOTE — Assessment & Plan Note (Signed)
Assessment/Plan: Joshua Wall has never had an opthalmology visit in the past. I ordered retinal/fundus photography today but we were unable to successfully administer this imaging. I will have patient return in 1 month for repeat retinal/fundus photography.

## 2018-04-28 NOTE — Assessment & Plan Note (Addendum)
Assessment: Mr. Schnelle has a 6 month history of intermittent burning eye pain and tearing consistent with dry eye vs allergic conjunctivitis. Her also has diabetes but I do not believe that a retinopathy would cause these types of symptoms.   Plan: 1. Prescribed olopatadine 0.1% apply 1 drop to each eye twice a day 2. Recommended using artificial tears 3. If symptoms continue, will consider referring to ophthalmology.

## 2018-04-28 NOTE — Assessment & Plan Note (Signed)
Assessment: Joshua Wall blood pressure is well-controlled on Losartan 25 mg QD.   Plan: 1. Continue Losartan 25 mg QD 2. Check CMP for electrolyte and renal abnormalities while on anti-hypertensive.

## 2018-04-28 NOTE — Progress Notes (Signed)
   CC: Follow-up of onychomycosis  HPI:  [name] is a 32 y.o. spanish-speaking male with HTN, T2DM, history of diverticulitis who presents for follow-up of onychomycosis.  Onychomycosis [name] has a 1 year history of recurrent toenail fungus. He was seen on 03/31/18 and prescribed terbinafine 50 mg QD x 12 weeks. He reports that he has been taking the medication for several weeks but stopped 3 days ago because he "did not like taking so many medicines". He has noticed improvement in his toe fungus. There were 3-4 affected toes per foot and now the first toe on each foot are the only ones affected.   Hypertension He was started on Losartan 25 mg QD on 03/31/18 (BP 140/89). He has not had any issues taking this medication. Blood pressure today (131/80).  Type II Diabetes [name] diabetes is well controlled on Metformin 1000 mg twice a day (last A1C 6.0 1 months ago). He has not had a retinal/fundus exam in the past.   Burning Eye Pain  He reports a 6 month history of intermittent burning pain. He states that he will blink several times and start to tear up. 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 He does however wear protective glasses when working. He denies any changes in his vision, eye discharge, or eye redness.   Past Medical History:  Diagnosis Date  . Acute diverticulitis 11/2017  . Diabetes mellitus without complication ([place])    Review of Systems:   Review of Systems  Constitutional: Negative for chills and fever.  HENT: Negative for congestion and sinus pain.   Eyes: Positive for pain. Negative for blurred vision and discharge.  Respiratory: Negative for cough and shortness of breath.   Cardiovascular: Negative for chest pain, palpitations and leg swelling.  Gastrointestinal: Negative for abdominal pain, diarrhea, nausea and vomiting.  Genitourinary: Negative for dysuria,  hematuria and urgency.  Skin: Negative for rash.  Neurological: Negative for headaches.     Physical Exam:  Vitals:   04/28/18 1535  BP: 131/80  Pulse: 71  Temp: 98.2 F (36.8 C)  TempSrc: Oral  SpO2: 98%  Weight: 259 lb 9.6 oz (117.8 kg)  Height: 5\' 8"  (1.727 m)   Physical Exam  Constitutional: He appears well-developed and well-nourished.  HENT:  Head: Normocephalic and atraumatic.  Eyes: Pupils are equal, round, and reactive to light. Conjunctivae, EOM and lids are normal. Right eye exhibits no discharge and no exudate. Left eye exhibits no discharge and no exudate.  Neck: Normal range of motion.  Cardiovascular: Normal rate and regular rhythm.  Pulmonary/Chest: Effort normal and breath sounds normal.  Musculoskeletal: He exhibits no edema.  Neurological: He is alert.  Hyperkeratotic and discolored toenails of the first toe on each foot.   Skin: Skin is warm and dry.  Psychiatric: He has a normal mood and affect. His behavior is normal.    Assessment & Plan:   See Encounters Tab for problem based charting.  Patient seen with Dr. Dareen Piano

## 2018-04-28 NOTE — Patient Instructions (Signed)
Use 1 gota de olopatadina en [place] para los ojos ardientes. Tambin puede comprar una lgrima artificial en el supermercado que puede poner 1 gota en cada ojo cada 30 minutos para su dolor ocular.  Te llamar con los resultados de tus anlisis de [place].

## 2018-04-28 NOTE — Assessment & Plan Note (Signed)
Assessment: Joshua Wall has a 1 year history of recurrent toenail fungus and is currently being treated with terbinafine 50 mg QD x 12 weeks. He has showed some improvement with only one toe on each foot showing signs of infection.  Plan: 1. Recommend continuing terbinafine 50 mg QD for 8 more weeks.  2. Checking LFT's today. If elevated, will call patient to discontinue terbinafine.

## 2018-04-29 LAB — CMP14 + ANION GAP
ALT: 39 IU/L (ref 0–44)
AST: 23 IU/L (ref 0–40)
Albumin/Globulin Ratio: 1.6 (ref 1.2–2.2)
Albumin: 4.6 g/dL (ref 3.5–5.5)
Alkaline Phosphatase: 70 IU/L (ref 39–117)
Anion Gap: 18 mmol/L (ref 10.0–18.0)
BUN/Creatinine Ratio: 24 — ABNORMAL HIGH (ref 9–20)
BUN: 16 mg/dL (ref 6–20)
Bilirubin Total: <0.2 mg/dL (ref 0.0–1.2)
CO2: 21 mmol/L (ref 20–29)
Calcium: 9.3 mg/dL (ref 8.7–10.2)
Chloride: 101 mmol/L (ref 96–106)
Creatinine, Ser: 0.66 mg/dL — ABNORMAL LOW (ref 0.76–1.27)
GFR calc Af Amer: 148 mL/min/{1.73_m2} (ref >59)
GFR calc non Af Amer: 128 mL/min/{1.73_m2} (ref >59)
Globulin, Total: 2.8 g/dL (ref 1.5–4.5)
Glucose: 130 mg/dL — ABNORMAL HIGH (ref 65–99)
Potassium: 4 mmol/L (ref 3.5–5.2)
Sodium: 140 mmol/L (ref 134–144)
Total Protein: 7.4 g/dL (ref 6.0–8.5)

## 2018-04-29 NOTE — Progress Notes (Signed)
Internal Medicine Clinic Attending  I saw and evaluated the patient.  I personally confirmed the key portions of the history and exam documented by Dr. Prince and I reviewed pertinent patient test results.  The assessment, diagnosis, and plan were formulated together and I agree with the documentation in the resident's note.  

## 2018-04-30 ENCOUNTER — Other Ambulatory Visit: Payer: Self-pay | Admitting: Internal Medicine

## 2018-04-30 ENCOUNTER — Telehealth: Payer: Self-pay | Admitting: *Deleted

## 2018-04-30 NOTE — Telephone Encounter (Signed)
Op pharm called to verify if pt scripts should be IM Program, pt has orange card

## 2018-05-01 NOTE — Telephone Encounter (Signed)
refilled 

## 2018-05-05 ENCOUNTER — Telehealth: Payer: Self-pay | Admitting: Internal Medicine

## 2018-05-05 NOTE — Telephone Encounter (Signed)
CMP unremarkable and no further work-up needed. I attempted to call Joshua Wall to inform him of his results but did not get an answer.

## 2018-05-28 ENCOUNTER — Encounter: Payer: Self-pay | Admitting: *Deleted

## 2018-06-04 ENCOUNTER — Encounter: Payer: Self-pay | Admitting: Gastroenterology

## 2018-06-04 ENCOUNTER — Ambulatory Visit (INDEPENDENT_AMBULATORY_CARE_PROVIDER_SITE_OTHER): Payer: Self-pay | Admitting: Gastroenterology

## 2018-06-04 VITALS — BP 140/92 | HR 80 | Ht 68.0 in | Wt 261.0 lb

## 2018-06-04 DIAGNOSIS — Z8719 Personal history of other diseases of the digestive system: Secondary | ICD-10-CM

## 2018-06-04 DIAGNOSIS — K625 Hemorrhage of anus and rectum: Secondary | ICD-10-CM

## 2018-06-04 DIAGNOSIS — R1032 Left lower quadrant pain: Secondary | ICD-10-CM

## 2018-06-04 MED ORDER — NA SULFATE-K SULFATE-MG SULF 17.5-3.13-1.6 GM/177ML PO SOLN: 1.0000 | Freq: Once | ORAL | 0 refills | Status: AC

## 2018-06-04 NOTE — Patient Instructions (Signed)
Continue taking Metamucil  You have been scheduled for a colonoscopy. Please follow written instructions given to you at your visit today.  Please pick up your prep supplies at the pharmacy within the next 1-3 days. If you use inhalers (even only as needed), please bring them with you on the day of your procedure. Your physician has requested that you go to www.[email or web address] and enter the access code given to you at your visit today. This web site gives a general overview about your procedure. However, you should still follow specific instructions given to you by our office regarding your preparation for the procedure.

## 2018-06-04 NOTE — Progress Notes (Signed)
Joshua Wall    [id number]    [birth year]  Primary Care Physician:Winfrey, Jenne Pane, MD  Referring Physician: Katherine Roan, MD [address] [place], [place] [address]  Chief complaint:  LLQ abdominal discomfort  HPI:  1 yr M with history of recurrent diverticulitis here for evaluation.  He was hospitalized twice in April with acute diverticulitis requiring IV antibiotics.  CT abdomen and pelvis on April 18, April 24, April 29 and Dec 24, 2017 showed acute diverticulitis in proximal to mid sigmoid colon.  On most recent CT in Dec 24, 2017 showed decreased inflammation compared to prior CT with no abscess or perforation.  He had 2 episodes of bright red blood per rectum in the last 2 weeks.  Denies excessive straining or change in bowel habits. No family history of IBD or colon cancer. He has no loss of appetite or weight loss.    Outpatient Encounter Medications as of 06/04/2018  Medication Sig  . losartan (COZAAR) 25 MG tablet Take 1 tablet (25 mg total) by mouth daily.  . metFORMIN (GLUCOPHAGE) 1000 MG tablet TAKE 1 TABLET BY MOUTH 2 TIMES DAILY WITH A MEAL.  Marland Kitchen psyllium (METAMUCIL) 58.6 % powder Take 1 packet by mouth daily.  . [DISCONTINUED] olopatadine (PATANOL) 0.1 % ophthalmic solution Place 1 drop into both eyes 2 (two) times daily.  . [DISCONTINUED] polyethylene glycol (MIRALAX) packet Take 17 g by mouth daily.   No facility-administered encounter medications on file as of 06/04/2018.     Allergies as of 06/04/2018  . (No Known Allergies)    Past Medical History:  Diagnosis Date  . Acute diverticulitis 11/2017  . Allergic rhinitis   . Colon polyps   . Diabetes mellitus without complication ([place])   . GERD (gastroesophageal reflux disease)   . Oropharyngeal dysphagia     No past surgical history on file.  No family history on file.  Social History   Socioeconomic History  . Marital status: Married    Spouse name: Not on file  . Number  of children: 5  . Years of education: Not on file  . Highest education level: Not on file  Occupational History  . Not on file  Social Needs  . Financial resource strain: Not on file  . Food insecurity:    Worry: Not on file    Inability: Not on file  . Transportation needs:    Medical: Not on file    Non-medical: Not on file  Tobacco Use  . Smoking status: Current Some Day Smoker    Types: Cigarettes  . Smokeless tobacco: Never Used  . Tobacco comment: 3 cigarettes per week.  Substance and Sexual Activity  . Alcohol use: Yes    Comment: Sometimes.  . Drug use: No  . Sexual activity: Yes    Partners: Female  Lifestyle  . Physical activity:    Days per week: Not on file    Minutes per session: Not on file  . Stress: Not on file  Relationships  . Social connections:    Talks on phone: Not on file    Gets together: Not on file    Attends religious service: Not on file    Active member of club or organization: Not on file    Attends meetings of clubs or organizations: Not on file    Relationship status: Not on file  . Intimate partner violence:    Fear of current or  ex partner: Not on file    Emotionally abused: Not on file    Physically abused: Not on file    Forced sexual activity: Not on file  Other Topics Concern  . Not on file  Social History Narrative  . Not on file      Review of systems: Review of Systems  Constitutional: Negative for fever and chills.  Positive for fatigue HENT: Positive for sinus trouble Eyes: Negative for blurred vision.  Respiratory: Negative for shortness of breath and wheezing.  Positive for cough Cardiovascular: Negative for chest pain and palpitations.  Gastrointestinal: as per HPI Genitourinary: Negative for dysuria, urgency, frequency and hematuria.  Musculoskeletal: Negative for myalgias, back pain and joint pain.  Skin: Negative for itching and rash.  Neurological: Negative for dizziness, tremors, focal weakness, seizures  and loss of consciousness.  Positive for headaches Endo/Heme/Allergies: Positive for seasonal allergies.  Psychiatric/Behavioral: Negative for depression, suicidal ideas and hallucinations.  All other systems reviewed and are negative.   Physical Exam: Vitals:   06/04/18 1353  BP: (!) 140/92  Pulse: 80   Body mass index is 39.68 kg/m. Gen:      No acute distress HEENT:  EOMI, sclera anicteric Neck:     No masses; no thyromegaly Lungs:    Clear to auscultation bilaterally; normal respiratory effort CV:         Regular rate and rhythm; no murmurs Abd:      + bowel sounds; soft, non-tender; no palpable masses, no distension Ext:    No edema; adequate peripheral perfusion Skin:      Warm and dry; no rash Neuro: alert and oriented x 3 Psych: normal mood and affect  Data Reviewed:  Reviewed labs, radiology imaging, old records and pertinent past GI work up   Assessment and Plan/Recommendations:  32 year old male with recurrent diverticulitis here for evaluation Schedule for colonoscopy to exclude neoplastic lesion Small-volume bright red blood per rectum likely secondary to bleeding from internal hemorrhoids Continue Metamucil and increased water intake May consider segmental sigmoid colectomy given recurrent diverticulitis, will refer to [facility] after colonoscopy  The risks and benefits as well as alternatives of endoscopic procedure(s) have been discussed and reviewed. All questions answered. The patient agrees to proceed.  Damaris Hippo , MD [phone]    CC: Katherine Roan, MD

## 2018-06-06 ENCOUNTER — Encounter: Payer: Self-pay | Admitting: Gastroenterology

## 2018-06-06 ENCOUNTER — Ambulatory Visit (AMBULATORY_SURGERY_CENTER): Payer: Self-pay | Admitting: Gastroenterology

## 2018-06-06 VITALS — BP 104/70 | HR 82 | Temp 98.4°F | Resp 16 | Ht 68.0 in | Wt 261.0 lb

## 2018-06-06 DIAGNOSIS — D122 Benign neoplasm of ascending colon: Secondary | ICD-10-CM

## 2018-06-06 DIAGNOSIS — K635 Polyp of colon: Secondary | ICD-10-CM

## 2018-06-06 DIAGNOSIS — K5730 Diverticulosis of large intestine without perforation or abscess without bleeding: Secondary | ICD-10-CM

## 2018-06-06 DIAGNOSIS — K621 Rectal polyp: Secondary | ICD-10-CM

## 2018-06-06 DIAGNOSIS — R1032 Left lower quadrant pain: Secondary | ICD-10-CM

## 2018-06-06 MED ORDER — SODIUM CHLORIDE 0.9 % IV SOLN: 500.0000 mL | Freq: Once | INTRAVENOUS | Status: DC

## 2018-06-06 MED ORDER — AMOXICILLIN-POT CLAVULANATE 875-125 MG PO TABS: 1.0000 | ORAL_TABLET | Freq: Two times a day (BID) | ORAL | 0 refills | Status: DC

## 2018-06-06 NOTE — Op Note (Signed)
[facility] Patient Name: Joshua Wall Procedure Date: 06/06/2018 2:40 PM MRN: [id number] Endoscopist: Mauri Pole , MD Age: 32 Referring MD:  Date of Birth: 12/25/1985 Gender: Male Account #: [id number] Procedure:                Colonoscopy Indications:              Follow-up of diverticulitis. Screening for                            colorectal cancer Medicines:                Monitored Anesthesia Care Procedure:                Pre-Anesthesia Assessment:                           - Prior to the procedure, a History and Physical                            was performed, and patient medications and                            allergies were reviewed. The patient's tolerance of                            previous anesthesia was also reviewed. The risks                            and benefits of the procedure and the sedation                            options and risks were discussed with the patient.                            All questions were answered, and informed consent                            was obtained. Prior Anticoagulants: The patient has                            taken no previous anticoagulant or antiplatelet                            agents. ASA Grade Assessment: II - A patient with                            mild systemic disease. After reviewing the risks                            and benefits, the patient was deemed in                            satisfactory condition to undergo the procedure.  After obtaining informed consent, the colonoscope                            was passed under direct vision. Throughout the                            procedure, the patient's blood pressure, pulse, and                            oxygen saturations were monitored continuously. The                            Colonoscope was introduced through the anus and                            advanced to the the terminal ileum, with                           identification of the appendiceal orifice and IC                            valve. The colonoscopy was performed without                            difficulty. The patient tolerated the procedure                            well. The quality of the bowel preparation was                            excellent. The terminal ileum, ileocecal valve,                            appendiceal orifice, and rectum were photographed. Scope In: 2:42:52 PM Scope Out: 3:02:26 PM Scope Withdrawal Time: 0 hours 17 minutes 5 seconds  Total Procedure Duration: 0 hours 19 minutes 34 seconds  Findings:                 The perianal and digital rectal examinations were                            normal.                           A 5 mm polyp was found in the ascending colon. The                            polyp was sessile. The polyp was removed with a                            cold snare. Resection and retrieval were complete.                           A 2 mm polyp was found in the ascending colon. The  polyp was sessile. The polyp was removed with a                            cold biopsy forceps. Resection and retrieval were                            complete.                           A 6 mm polyp was found in the rectum. The polyp was                            pedunculated. The polyp was removed with a hot                            snare. Resection and retrieval were complete.                           Scattered small and large-mouthed diverticula were                            found in the sigmoid colon. Peri-diverticular                            erythema was seen. Purulent discharge was seen in                            association with the diverticular opening,                            suspicious of diverticulitis.                           Non-bleeding internal hemorrhoids were found during                            retroflexion. The hemorrhoids  were small. Complications:            No immediate complications. Estimated Blood Loss:     Estimated blood loss was minimal. Impression:               - One 5 mm polyp in the ascending colon, removed                            with a cold snare. Resected and retrieved.                           - One 2 mm polyp in the ascending colon, removed                            with a cold biopsy forceps. Resected and retrieved.                           - One 6 mm polyp in the rectum, removed with a hot  snare. Resected and retrieved.                           - Moderate diverticulosis in the sigmoid colon.                            Peri-diverticular erythema was seen. Purulent                            discharge was seen in association with the                            diverticular opening, suspicious of diverticulitis.                           - Non-bleeding internal hemorrhoids. Recommendation:           - Patient has a contact number available for                            emergencies. The signs and symptoms of potential                            delayed complications were discussed with the                            patient. Return to normal activities tomorrow.                            Written discharge instructions were provided to the                            patient.                           - Resume previous diet.                           - Continue present medications.                           - Await pathology results.                           - No aspirin, ibuprofen, naproxen, or other                            non-steroidal anti-inflammatory drugs.                           - Repeat colonoscopy in 3 - 5 years for                            surveillance based on pathology results.                           - Augmentin (amoxicillin/clavulanate) 875 mg PO BID  for 10 days. Mauri Pole, MD 06/06/2018 3:10:19  PM This report has been signed electronically.

## 2018-06-06 NOTE — Progress Notes (Signed)
Called to room to assist during endoscopic procedure.  Patient ID and intended procedure confirmed with present staff. Received instructions for my participation in the procedure from the performing physician.  

## 2018-06-06 NOTE — Progress Notes (Signed)
Report given to PACU, vss 

## 2018-06-06 NOTE — Patient Instructions (Signed)
USTED TUVO UN PROCEDIMIENTO ENDOSCPICO HOY EN [facility]:   Lea el informe del procedimiento que se le entreg para cualquier pregunta especfica sobre lo que se encontr durante su examen.  Si el informe del examen no responde a sus preguntas, por favor llame a su gastroenterlogo para aclararlo.  Si usted solicit que no se le den detalles de lo que se encontr en su procedimiento al acompaante que le va a cuidar, entonces el informe del procedimiento se ha incluido en un sobre sellado para que usted lo revise despus cuando le sea ms conveniente.   LO QUE PUEDE ESPERAR: Algunas sensaciones de hinchazn en el abdomen.  Puede tener ms gases de lo normal.  El caminar puede ayudarle a eliminar el aire que se le puso en el tracto gastrointestinal durante el procedimiento y reducir la hinchazn.  Si le hicieron una endoscopia inferior (como una colonoscopia o una sigmoidoscopia flexible), podra notar manchas de sangre en las heces fecales o en el papel higinico.  Si se someti a una preparacin intestinal para su procedimiento, es posible que no tenga una evacuacin intestinal normal durante algunos das.   Tenga en cuenta:  Es posible que note un poco de irritacin y congestin en la nariz o algn drenaje.  Esto es debido al oxgeno utilizado durante su procedimiento.  No hay que preocuparse y esto debe desaparecer ms o menos en un da.   SNTOMAS PARA REPORTAR INMEDIATAMENTE:  Despus de una endoscopia inferior (colonoscopia o sigmoidoscopia flexible):  Cantidades excesivas de sangre en las heces fecales  Sensibilidad significativa o empeoramiento de los dolores abdominales   Hinchazn aguda del abdomen que antes no tena   Fiebre de 100F o ms   Despus de la endoscopia superior (EGD)  Vmitos de sangre o material como caf molido   Dolor en el pecho o dolor debajo de los omplatos que antes no tena   Dolor o dificultad persistente para tragar  Falta de aire que antes no  tena   Fiebre de 100F o ms  Heces fecales negras y pegajosas   Para asuntos urgentes o de emergencia, puede comunicarse con un gastroenterlogo a cualquier hora llamando al [phone].  DIETA:  Recomendamos una comida pequea al principio, pero luego puede continuar con su dieta normal.  Tome muchos lquidos, pero debe evitar las bebidas alcohlicas durante 24 horas.    ACTIVIDAD:  Debe planear tomarse las cosas con calma por el resto del da y no debe CONDUCIR ni usar maquinaria pesada hasta maana (debido a los medicamentos de sedacin utilizados durante el examen).     SEGUIMIENTO: Nuestro personal llamar al nmero que aparece en su historial al siguiente da hbil de su procedimiento para ver cmo se siente y para responder cualquier pregunta o inquietud que pueda tener con respecto a la informacin que se le dio despus del procedimiento. Si no podemos contactarle, le dejaremos un mensaje.  Sin embargo, si se siente bien y no tiene ningn problema, no es necesario que nos devuelva la llamada.  Asumiremos que ha regresado a sus actividades diarias normales sin incidentes. Si se le tomaron algunas biopsias, le contactaremos por telfono o por carta en las prximas 3 semanas.  Si no ha sabido nada sobre las biopsias en el transcurso de 3 semanas, por favor llmenos al [phone].   FIRMAS/CONFIDENCIALIDAD: Usted y/o el acompaante que le cuide han firmado documentos que se ingresarn en su historial mdico electrnico.  Estas firmas atestiguan el hecho   de que la informacin anterior Interpreter used today at the [employer] for this pt.  Interpreter's name is-Interpreter used today at the [facility] for this pt.  Interpreter's name is-Peppe Delatore  Polipos , hemorroides, and diverticulosis information

## 2018-06-09 ENCOUNTER — Telehealth: Payer: Self-pay

## 2018-06-09 NOTE — Telephone Encounter (Signed)
  Follow up Call-  Call back number 06/06/2018  Post procedure Call Back phone  # [phone]  Permission to leave phone message Yes     Patient questions:  Do you have a fever, pain , or abdominal swelling? No. Pain Score  0 *  Have you tolerated food without any problems? Yes.    Have you been able to return to your normal activities? Yes.    Do you have any questions about your discharge instructions: Diet   No. Medications  No. Follow up visit  No.  Do you have questions or concerns about your Care? No.  Actions: * If pain score is 4 or above: No action needed, pain <4.

## 2018-06-13 ENCOUNTER — Encounter: Payer: Self-pay | Admitting: Gastroenterology

## 2018-06-29 IMAGING — CT CT ABD-PELV W/ CM
2 of 4 series · 16 of 46 positions shown, 18 images · IV contrast (APPLIED)
Comparison: December 02, 2017

CLINICAL DATA: Recent diverticulitis

EXAM:
CT ABDOMEN AND PELVIS WITH CONTRAST
TECHNIQUE: Multidetector CT imaging of the abdomen and pelvis was performed
using the standard protocol following bolus administration of
intravenous contrast. Oral contrast was also administered.
CONTRAST:  100mL OMNIPAQUE IOHEXOL 300 MG/ML  SOLN

[Series 3: abd/ pelvis 5.0 i30f 2 · axial · 0.90mm/px · z∈[+768,+1243]mm · 13 of 105 slices shown, 15 images]
[im 5/105  soft-tissue]
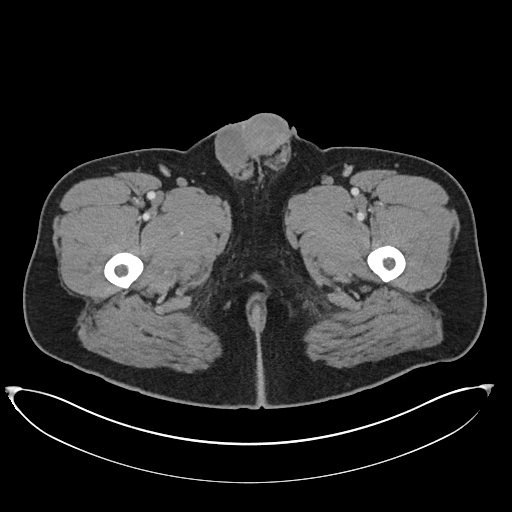
[im 5/105  bone]
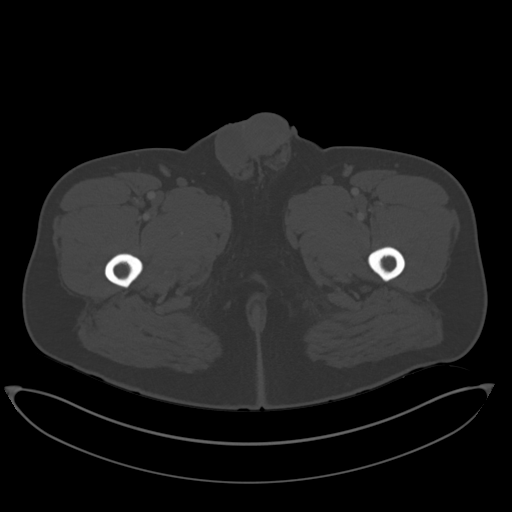
[im 14/105  soft-tissue]
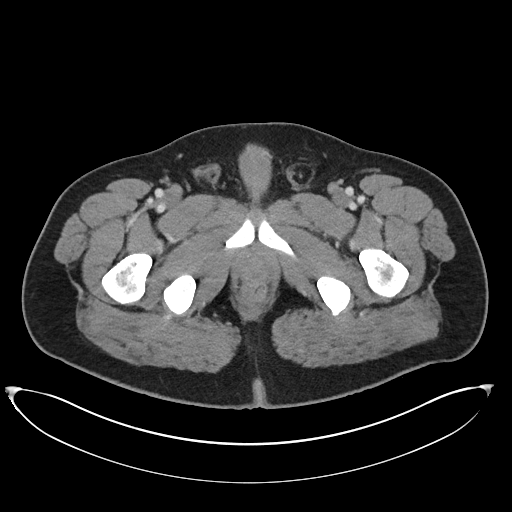
[im 22/105  soft-tissue]
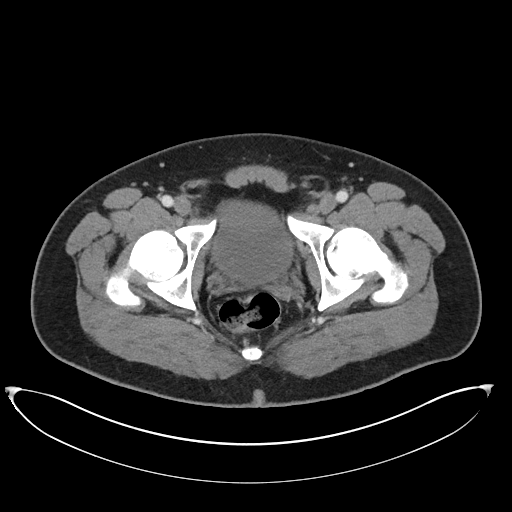
[im 31/105  soft-tissue]
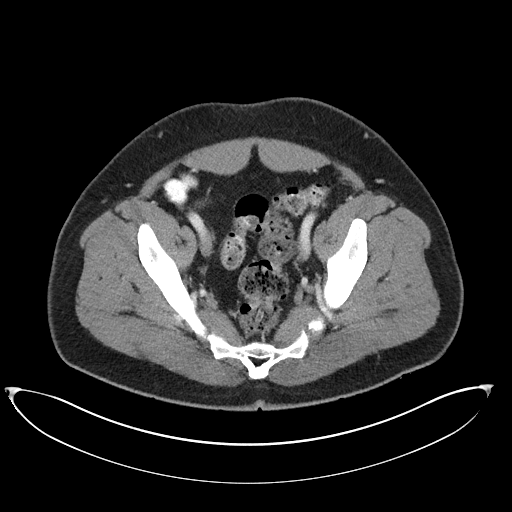
[im 35/105  soft-tissue]
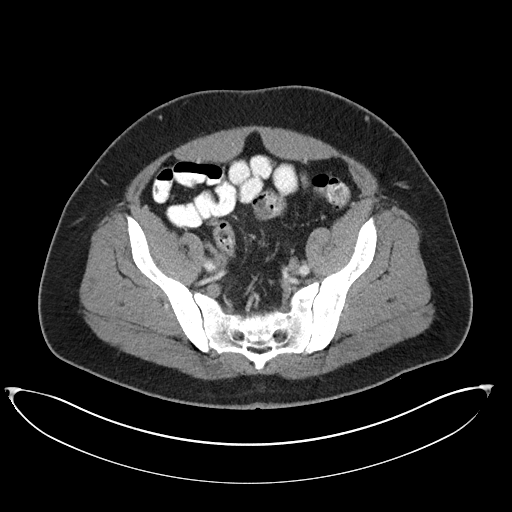
[im 44/105  soft-tissue]
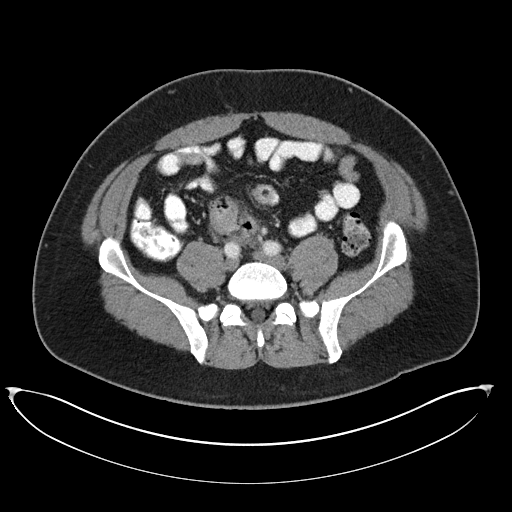
[im 53/105  soft-tissue]
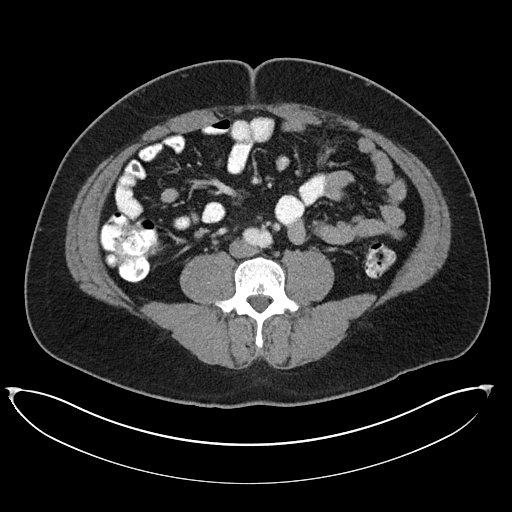
[im 61/105  soft-tissue]
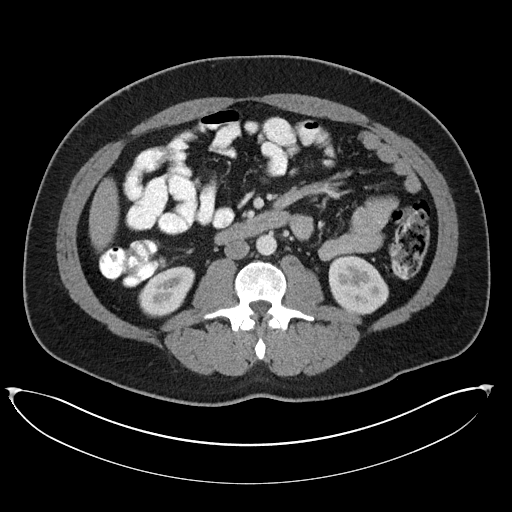
[im 70/105  soft-tissue]
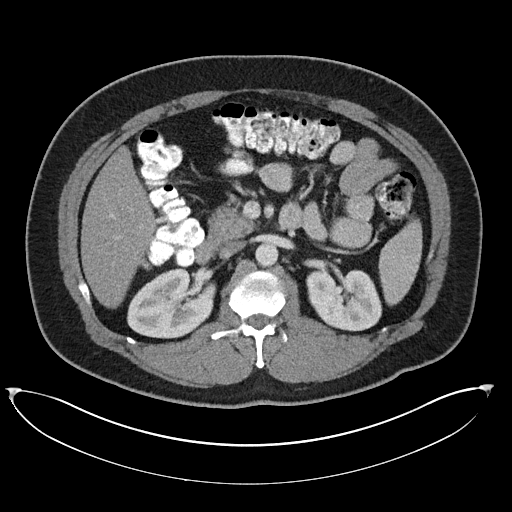
[im 70/105  bone]
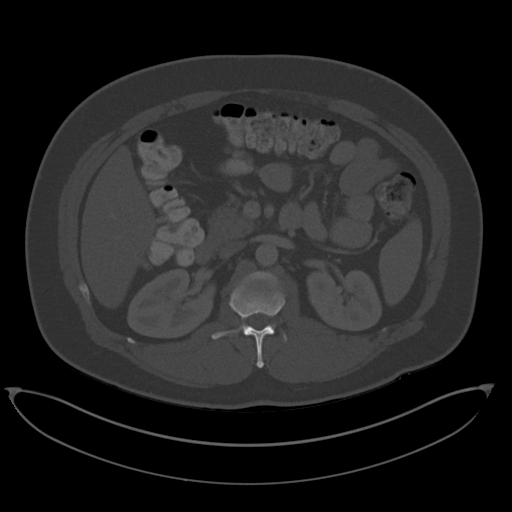
[im 74/105  soft-tissue]
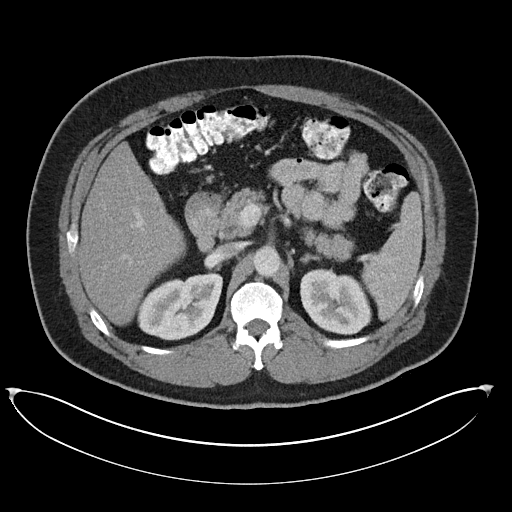
[im 83/105  soft-tissue]
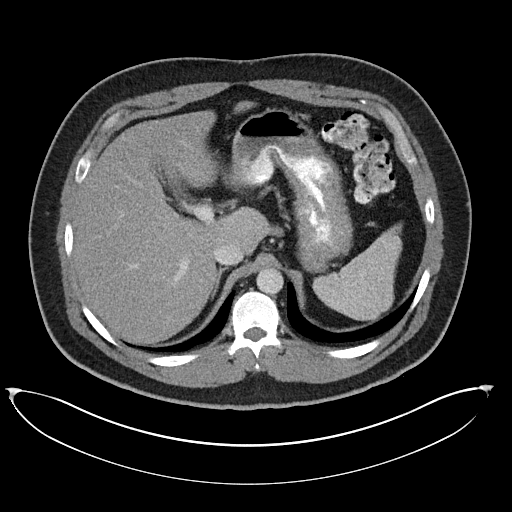
[im 92/105  soft-tissue]
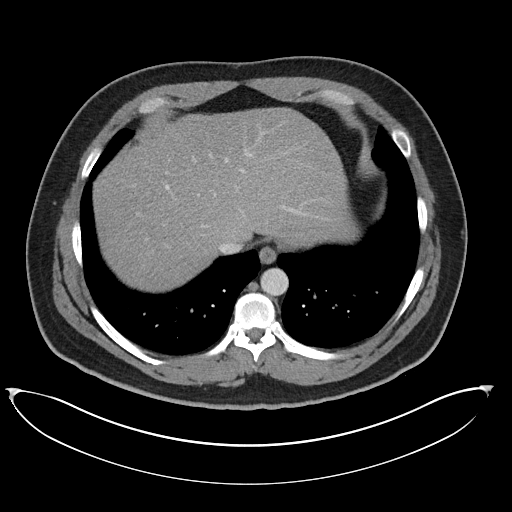
[im 100/105  soft-tissue]
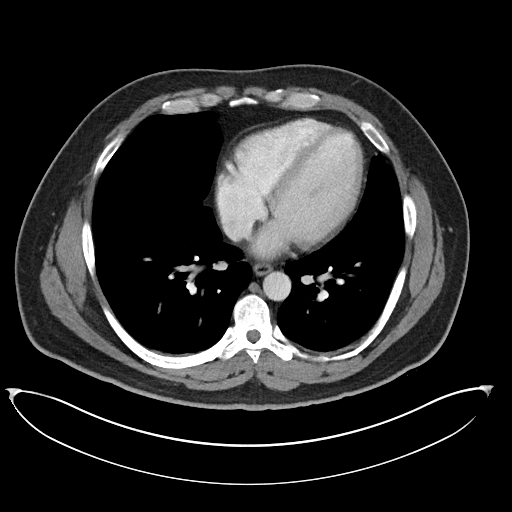

[Series 6: coronal soft tissue · coronal · 0.98mm/px · 3 of 115 slices shown]
[im 39/115  soft-tissue]
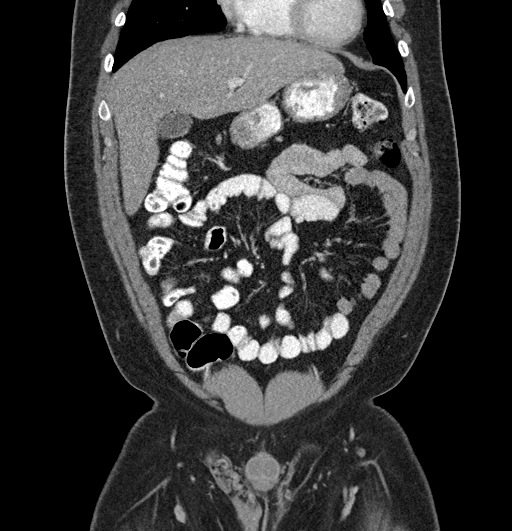
[im 51/115  soft-tissue]
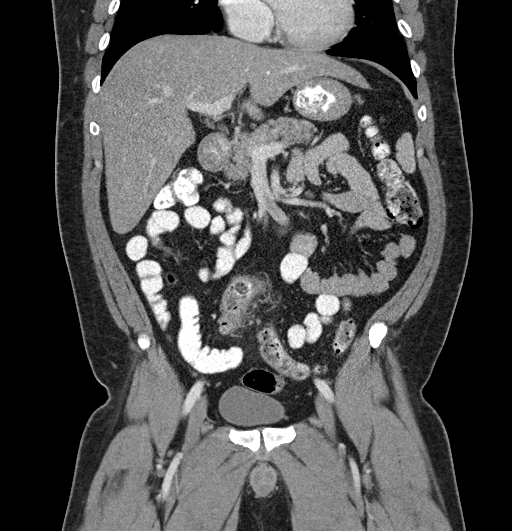
[im 64/115  soft-tissue]
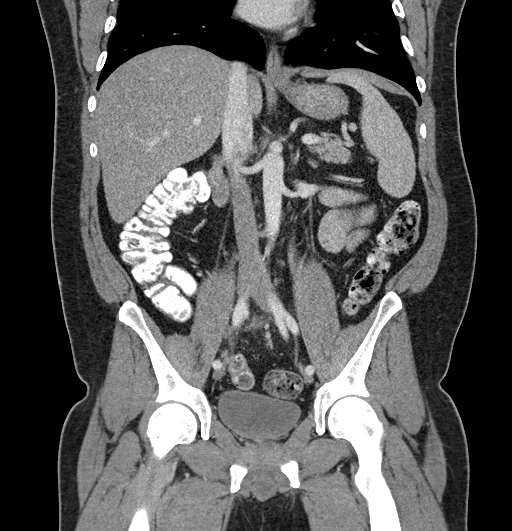

[16 of 46 positions shown; findings below may reference images not displayed]

FINDINGS: Lower chest: Lung bases are clear.

Hepatobiliary: There is hepatic steatosis. No focal liver lesions
are evident. Gallbladder wall is not appreciably thickened. There is
no biliary duct dilatation.

Pancreas: No pancreatic mass or inflammatory focus.

Spleen: No splenic lesions are evident.

Adrenals/Urinary Tract: Adrenals bilaterally appear normal. Kidneys
bilaterally show no evident mass or hydronephrosis on either side.
There is no appreciable renal or ureteral calculus on either side.
Urinary bladder is midline with wall thickness within normal limits.

Stomach/Bowel: There is again noted wall thickening with surrounding
mesenteric inflammation in the proximal to mid sigmoid colon
consistent with diverticulitis. There is somewhat less inflammation
in this region compared to prior study. Currently there is no
demonstrable abscess or perforation in this region. There has been
no progression or new focus of diverticulitis. There are scattered
diverticula elsewhere in the sigmoid and descending colon regions
without inflammation these areas. No bowel obstruction evident. No
free air or portal venous air.

Vascular/Lymphatic: There is no abdominal aortic aneurysm. No
vascular lesions are evident. No adenopathy is appreciable in the
abdomen or pelvis.

Reproductive: Prostate and seminal vesicles appear normal in size
and contour.

Other: There is fat in each inguinal ring. There is a minimal
ventral hernia containing only fat. Appendix appears diminutive but
normal. No periappendiceal region inflammation. No abscess or
ascites evident in the abdomen or pelvis.

Musculoskeletal: No blastic or lytic bone lesions. No intramuscular
or abdominal wall lesions evident.
IMPRESSION: 1. There remains diverticulitis in the proximal to mid sigmoid
colon. There is somewhat less inflammation in this area compared to
prior CT examination. No abscess or perforation seen in this area.
No new foci of diverticulitis elsewhere. There is diverticulosis
throughout much of the remaining sigmoid colon as well as at several
areas in the descending colon.

2. No bowel obstruction. No abscess in the abdomen or pelvis. No
periappendiceal region inflammation.

3.  No evident renal or ureteral calculi.  No hydronephrosis.

4.  Hepatic steatosis.  No focal liver lesions.

5. Small ventral hernia containing only fat. There is fat in each
inguinal ring.

## 2018-08-19 ENCOUNTER — Ambulatory Visit: Payer: Self-pay | Admitting: Internal Medicine

## 2018-08-19 ENCOUNTER — Other Ambulatory Visit: Payer: Self-pay

## 2018-08-19 DIAGNOSIS — K0889 Other specified disorders of teeth and supporting structures: Secondary | ICD-10-CM

## 2018-08-19 NOTE — Assessment & Plan Note (Signed)
Patient presented with tooth pain. Started 3 weeks ago and worsened recently.  Has constant pain 8-10 mostly when he eats and chews food. Tylenol and orajel did not completely help. No fever, no chills.  On exam: He is afebrile, has dental cavities and caries on upper and lower molar/premolar teeth at right side. No Abscess.   -Send referral to dentist

## 2018-08-19 NOTE — Patient Instructions (Signed)
Thank you for allowing [place] to provide your care today. Today you came to the clinic due to tooth pain. On exam you have visible dental carries at  right upper and lower teeth. You do not have fever and rest of your vital sign are normal. I send a referral to dentist. Please follow up with them for further treatment.  Today we did not make any changes to your medications.    Should you have any questions or concerns please call the internal medicine clinic at [phone].    Thank you

## 2018-08-19 NOTE — Progress Notes (Signed)
   CC: tooth pain  HPI:  [name] is a 33 y.o. with PMHx listed below, came to the clinic sue to tooth pain. Please see problem based charting for further details and assessment and plan.   Past Medical History:  Diagnosis Date  . Acute diverticulitis 11/2017  . Allergic rhinitis   . Colon polyps   . Diabetes mellitus without complication ([place])   . GERD (gastroesophageal reflux disease)   . Oropharyngeal dysphagia    Review of Systems:  Review of Systems  Constitutional: Negative for chills and fever.  Respiratory: Negative for cough and shortness of breath.   Gastrointestinal: Negative for abdominal pain, diarrhea, nausea and vomiting.     Physical Exam:  Vitals:   08/19/18 1409  BP: (!) 128/91  Pulse: 70  Temp: 98 F (36.7 C)  TempSrc: Oral  SpO2: 98%  Weight: 256 lb 3.2 oz (116.2 kg)  Height: 5\' 8"  (1.727 m)  Physical Exam Constitutional:      General: He is not in acute distress.    Appearance: Normal appearance. He is not ill-appearing.  HENT:     Mouth/Throat:     Comments: Dental cavities at right upper and lower 2 molar/premolar teeth. No evidence of abscess. No submandibular or auricular LAP. Cardiovascular:     Rate and Rhythm: Normal rate and regular rhythm.     Heart sounds: No murmur.  Pulmonary:     Effort: Pulmonary effort is normal.     Breath sounds: Normal breath sounds. No wheezing or rales.  Abdominal:     Palpations: Abdomen is soft.     Tenderness: There is no abdominal tenderness.  Musculoskeletal: Normal range of motion.     Left lower leg: No edema.  Neurological:     Mental Status: He is alert and oriented to person, place, and time.  Psychiatric:        Mood and Affect: Mood normal.        Behavior: Behavior normal.     Assessment & Plan:   See Encounters Tab for problem based charting.  Patient discussed with Dr. Evette Doffing

## 2018-08-20 NOTE — Progress Notes (Signed)
Internal Medicine Clinic Attending  Case discussed with Dr. Masoudi  at the time of the visit.  We reviewed the resident's history and exam and pertinent patient test results.  I agree with the assessment, diagnosis, and plan of care documented in the resident's note.  

## 2018-09-29 ENCOUNTER — Encounter: Payer: Self-pay | Admitting: Internal Medicine

## 2018-09-29 ENCOUNTER — Other Ambulatory Visit: Payer: Self-pay

## 2018-09-29 ENCOUNTER — Ambulatory Visit (INDEPENDENT_AMBULATORY_CARE_PROVIDER_SITE_OTHER): Payer: Self-pay | Admitting: Internal Medicine

## 2018-09-29 VITALS — BP 124/74 | HR 78 | Temp 97.9°F | Ht 64.0 in | Wt 262.7 lb

## 2018-09-29 DIAGNOSIS — E785 Hyperlipidemia, unspecified: Secondary | ICD-10-CM

## 2018-09-29 DIAGNOSIS — E119 Type 2 diabetes mellitus without complications: Secondary | ICD-10-CM

## 2018-09-29 DIAGNOSIS — I10 Essential (primary) hypertension: Secondary | ICD-10-CM

## 2018-09-29 DIAGNOSIS — Z7984 Long term (current) use of oral hypoglycemic drugs: Secondary | ICD-10-CM

## 2018-09-29 DIAGNOSIS — Z872 Personal history of diseases of the skin and subcutaneous tissue: Secondary | ICD-10-CM

## 2018-09-29 DIAGNOSIS — Z79899 Other long term (current) drug therapy: Secondary | ICD-10-CM

## 2018-09-29 DIAGNOSIS — E1169 Type 2 diabetes mellitus with other specified complication: Secondary | ICD-10-CM

## 2018-09-29 DIAGNOSIS — F17211 Nicotine dependence, cigarettes, in remission: Secondary | ICD-10-CM

## 2018-09-29 LAB — POCT GLYCOSYLATED HEMOGLOBIN (HGB A1C): HEMOGLOBIN A1C: 7.2 % — AB (ref 4.0–5.6)

## 2018-09-29 LAB — GLUCOSE, CAPILLARY: Glucose-Capillary: 201 mg/dL — ABNORMAL HIGH (ref 70–99)

## 2018-09-29 NOTE — Progress Notes (Signed)
   CC: T2DM, HTN, HLD  HPI:  [name] is a 33 y.o. male with PMH below.  Today we will address T2DM, HTN, HLD  Please see A&P for status of the patient's chronic medical conditions  Past Medical History:  Diagnosis Date  . Acute diverticulitis 11/2017  . Allergic rhinitis   . Colon polyps   . Diabetes mellitus without complication ([place])   . GERD (gastroesophageal reflux disease)   . Oropharyngeal dysphagia    Review of Systems:  ROS: Pulmonary: pt denies increased work of breathing, shortness of breath,  Cardiac: pt denies palpitations, chest pain,  Abdominal: pt denies abdominal pain, nausea, vomiting, or diarrhea   Physical Exam:  Vitals:   09/29/18 1613  BP: 124/74  Pulse: 78  Temp: 97.9 F (36.6 C)  TempSrc: Oral  SpO2: 98%  Weight: 262 lb 11.2 oz (119.2 kg)  Height: 5\' 4"  (1.626 m)   Cardiac: normal rate and rhythm, clear s1 and s2, no murmurs, rubs or gallops Pulmonary: CTAB, not in distress Abdominal: non distended abdomen, soft and nontender Extremities: no LE edema.  Normal DP and PT pulses bilaterally, tinea pedis/onchomycosis has resolved Psych: Alert, conversant, in good spirits   Social History   Socioeconomic History  . Marital status: Married    Spouse name: Not on file  . Number of children: 5  . Years of education: Not on file  . Highest education level: Not on file  Occupational History  . Not on file  Social Needs  . Financial resource strain: Not on file  . Food insecurity:    Worry: Not on file    Inability: Not on file  . Transportation needs:    Medical: Not on file    Non-medical: Not on file  Tobacco Use  . Smoking status: Former Smoker    Types: Cigarettes    Last attempt to quit: 03/06/2018    Years since quitting: 0.5  . Smokeless tobacco: Never Used  Substance and Sexual Activity  . Alcohol use: Yes    Comment: Sometimes.  . Drug use: No  . Sexual activity: Yes    Partners: Female  Lifestyle  . Physical  activity:    Days per week: Not on file    Minutes per session: Not on file  . Stress: Not on file  Relationships  . Social connections:    Talks on phone: Not on file    Gets together: Not on file    Attends religious service: Not on file    Active member of club or organization: Not on file    Attends meetings of clubs or organizations: Not on file    Relationship status: Not on file  . Intimate partner violence:    Fear of current or ex partner: Not on file    Emotionally abused: Not on file    Physically abused: Not on file    Forced sexual activity: Not on file  Other Topics Concern  . Not on file  Social History Narrative  . Not on file    History reviewed. No pertinent family history.  Assessment & Plan:   See Encounters Tab for problem based charting.  Patient discussed with Dr. Angelia Mould

## 2018-09-29 NOTE — Patient Instructions (Signed)
Mr. Warehime you are doing well.  Your A1c is little higher today is 7.2 however it is not at a level where we need to be alarmed.  I encourage the you and your wife her joining a gym and you are beginning to exercise more.  Think it is very reasonable for you to try diet and exercise to help bring your blood sugars under good control.  We will follow-up in a few months and see how you are doing and repeat the A1c.  I will check your cholesterol today, you will need to be started on a cholesterol medicine given that you also have diabetes.  I will let you know the best cholesterol medicine for you based on what your lab result is.  For your vision, we will try to put in a referral for you 1 March when we have more orange card referrals available.  Hopefully by then you will have the orange card.  We will stop your blood pressure for now based on your urine result we may or may not need to restart it.  I will call you with your results shortly.

## 2018-09-30 ENCOUNTER — Telehealth: Payer: Self-pay | Admitting: Internal Medicine

## 2018-09-30 ENCOUNTER — Ambulatory Visit: Payer: Self-pay

## 2018-09-30 DIAGNOSIS — E1169 Type 2 diabetes mellitus with other specified complication: Secondary | ICD-10-CM

## 2018-09-30 DIAGNOSIS — E785 Hyperlipidemia, unspecified: Principal | ICD-10-CM

## 2018-09-30 LAB — MICROALBUMIN / CREATININE URINE RATIO
Creatinine, Urine: 96.5 mg/dL
MICROALBUM., U, RANDOM: 6.8 ug/mL
Microalb/Creat Ratio: 7 mg/g creat (ref 0–29)

## 2018-09-30 LAB — LIPID PANEL
CHOL/HDL RATIO: 4.9 ratio (ref 0.0–5.0)
Cholesterol, Total: 175 mg/dL (ref 100–199)
HDL: 36 mg/dL — ABNORMAL LOW (ref >39)
TRIGLYCERIDES: 529 mg/dL — AB (ref 0–149)

## 2018-09-30 MED ORDER — ROSUVASTATIN CALCIUM 20 MG PO TABS: 20.0000 mg | ORAL_TABLET | Freq: Every day | ORAL | 4 refills | Status: DC

## 2018-09-30 NOTE — Assessment & Plan Note (Signed)
Patient reports taking full dose metformin as prescribed.  His A1c is slowly been trending up today is 7.2.  Had a discussion with him and the wife they report he is been eating all the wrong things, breads rice,sweets etc. despite his wife's advice.  At this time they would like to change his diet.  They also report that they just joined a gym together and started exercising.   -Continue metformin full dose -Lifestyle modifications as above

## 2018-09-30 NOTE — Assessment & Plan Note (Signed)
Pt has been out of losartan for months now.  His bp is excellent today.  We checked a urine microalbumin/creatinine which is neg for sig proteinuria.   -d/c losartan

## 2018-09-30 NOTE — Assessment & Plan Note (Signed)
Lab Results  Component Value Date   CHOL 175 09/29/2018   HDL 36 (L) 09/29/2018   LDLCALC Comment 09/29/2018   TRIG 529 (H) 09/29/2018   CHOLHDL 4.9 09/29/2018   Checked lipid panel today, high triglycerides and incalculable LDL.    -started crestor 20mg 

## 2018-09-30 NOTE — Telephone Encounter (Signed)
Discussed test results and starting crestor for HLD

## 2018-10-01 NOTE — Progress Notes (Signed)
Internal Medicine Clinic Attending  Case discussed with Dr. Winfrey  at the time of the visit.  We reviewed the resident's history and exam and pertinent patient test results.  I agree with the assessment, diagnosis, and plan of care documented in the resident's note.  

## 2018-10-15 ENCOUNTER — Telehealth: Payer: Self-pay

## 2018-10-15 NOTE — Telephone Encounter (Signed)
Pt's wife requesting to speak with a nurse about meds. Please call back.  

## 2018-10-15 NOTE — Telephone Encounter (Signed)
Please have the patient stop the crestor I will call him on Monday and prescribe a different statin.  Thanks!

## 2018-10-15 NOTE — Telephone Encounter (Signed)
Pt's wife calls and states that since starting crestor he is having very bad abd cramping, pain and nausea. Please advise

## 2018-10-15 NOTE — Telephone Encounter (Signed)
Called and informed spouse of dr winfrey's message

## 2018-10-22 ENCOUNTER — Telehealth: Payer: Self-pay | Admitting: Internal Medicine

## 2018-10-22 DIAGNOSIS — E1169 Type 2 diabetes mellitus with other specified complication: Secondary | ICD-10-CM

## 2018-10-22 DIAGNOSIS — E785 Hyperlipidemia, unspecified: Principal | ICD-10-CM

## 2018-10-22 MED ORDER — PRAVASTATIN SODIUM 20 MG PO TABS: 20.0000 mg | ORAL_TABLET | Freq: Every day | ORAL | 0 refills | Status: DC

## 2018-10-22 NOTE — Telephone Encounter (Signed)
left message about switching statin therapy

## 2018-11-12 ENCOUNTER — Telehealth: Payer: Self-pay | Admitting: Internal Medicine

## 2018-11-12 NOTE — Telephone Encounter (Signed)
   Reason for call:   I received a call from [name]'s wife, who translated for him, at 7:00 PM indicating return of body aches and nausea after starting new statin medication.   Pertinent Data:   Patient's medication was recently changed from Crestor to pravastatin due to "abd cramping, pain and nausea" per prior telephone notes.  He has begun to experience the same symptoms again while on the new medication. The only new finding described by his wife is a temperature elevated to 100.2  Wife expressed som concern over the possibility of this being related to COVID-19 given the ongoing pandemic. Patient denies new cough or shortness of breath. No true fevers.   Assessment / Plan / Recommendations:   Likely recurrence of statin side effects, despite changing agent as symptoms are the same as previous.  Patient instructed to stop this statin and monitor for improvement in symptoms.  If symptoms fail to improve, they were instructed to call back for phone visit or clinic visit.  Covid infection unlikely, given similarity to prior side effect symptoms, but informed them they can separate him from family while they await symptom improvement for peace of mind as the virus can present with GI symptoms   As always, pt is advised that if symptoms worsen or new symptoms arise, they should go to an urgent care facility or to to ER for further evaluation.   Joshua Seat, MD   11/12/2018, 7:32 PM

## 2018-11-13 ENCOUNTER — Other Ambulatory Visit: Payer: Self-pay

## 2018-11-13 ENCOUNTER — Ambulatory Visit (INDEPENDENT_AMBULATORY_CARE_PROVIDER_SITE_OTHER): Payer: Self-pay | Admitting: Internal Medicine

## 2018-11-13 ENCOUNTER — Telehealth: Payer: Self-pay

## 2018-11-13 DIAGNOSIS — T50905A Adverse effect of unspecified drugs, medicaments and biological substances, initial encounter: Secondary | ICD-10-CM

## 2018-11-13 NOTE — Telephone Encounter (Signed)
Questions about med. Please call pt back.  

## 2018-11-13 NOTE — Assessment & Plan Note (Signed)
HPI: Patient called in with concerns of fevers. States that he was recently started on pravastatin. Two days ago he developed diarrhea and subsequently developed fevers with a Tmax of 101.4 F, myalgias, headaches, and abdominal pain. He subsequently stopped taking the pravastatin and is now back to baseline. He is tolerating PO intake. He has had no more fevers. In all other symptoms have resolved. 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 He has had no sick contact.  A/P: - Possible that he experienced a drug reaction but also possible that he had a gastroenteritis given his symptom progression and time course.  - He has stopped the pravastatin and will discuss statin use with his PCP

## 2018-11-13 NOTE — Progress Notes (Signed)
   CC: Fevers  This is a telephone encounter between Joshua Wall and [employer] on 11/13/2018 for fevers. The visit was conducted with the patient located at home and [facility] at [facility]. The patient's identity was confirmed using their DOB and current address. The patient has consented to being evaluated through a telephone encounter and understands the associated risks (an examination cannot be done and the patient may need to come in for an appointment) / benefits (allows the patient to remain at home, decreasing exposure to coronavirus). I personally spent 8 minutes on medical discussion.   HPI:  Joshua Wall is a 33 y.o. with PMH as below.   Please see A&P for assessment of the patient's acute and chronic medical conditions.   Past Medical History:  Diagnosis Date  . Acute diverticulitis 11/2017  . Allergic rhinitis   . Colon polyps   . Diabetes mellitus without complication ([place])   . GERD (gastroesophageal reflux disease)   . Oropharyngeal dysphagia    Review of Systems:  Performed and all others negative.  Assessment & Plan:   See Encounters Tab for problem based charting.  Patient discussed with Dr. Daryll Drown

## 2018-11-13 NOTE — Telephone Encounter (Signed)
NURSING TRIAGE NOTE FOR RESPIRATORY SYMPTOMS  Do you have a fever? 101.4 at 0300   Do you have a cough?no   Do you have shortness of breath more than normal?yes, yesterday   Do you have chest pain?no  Are you able to eat and drink normally? Yes   Have you seen a physician for these symptoms?no   Action instructed her pt not to go to work, stay away from family members as much as possible. Drink plenty of fluids, hand hygiene surfaces clean call 911 for severe short of breath, chest pain, weakness   I informed patient to expect a phone call from a physician soon and I sent request to front desk to schedule a virtual office appt for patient and arrive the patient.  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 She states fever, h/a, body aches, stomach pain [phone]

## 2018-11-16 NOTE — Progress Notes (Signed)
Internal Medicine Clinic Attending  Case discussed with Dr. Helberg soon after the resident saw the patient.  We reviewed the resident's history, telephone conversation and pertinent patient test results.  I agree with the assessment, diagnosis, and plan of care documented in the resident's note.   

## 2018-11-20 ENCOUNTER — Telehealth: Payer: Self-pay | Admitting: Internal Medicine

## 2018-11-20 NOTE — Telephone Encounter (Signed)
Spoke with pt and his wife.

## 2018-11-20 NOTE — Telephone Encounter (Signed)
Pt requesting Dr Shan Levans to call; [phone]

## 2018-11-20 NOTE — Telephone Encounter (Signed)
pt will take pravastatin every other day until follow up appt, his fever and abdominal pain has resolved I do not think they were related.

## 2018-12-22 ENCOUNTER — Other Ambulatory Visit: Payer: Self-pay

## 2018-12-22 ENCOUNTER — Encounter: Payer: Self-pay | Admitting: Internal Medicine

## 2018-12-22 ENCOUNTER — Encounter: Payer: Self-pay | Admitting: Dietician

## 2018-12-22 ENCOUNTER — Ambulatory Visit (INDEPENDENT_AMBULATORY_CARE_PROVIDER_SITE_OTHER): Payer: Self-pay | Admitting: Internal Medicine

## 2018-12-22 ENCOUNTER — Ambulatory Visit (INDEPENDENT_AMBULATORY_CARE_PROVIDER_SITE_OTHER): Payer: Self-pay | Admitting: Dietician

## 2018-12-22 VITALS — BP 130/90 | HR 90 | Temp 98.3°F | Ht 68.5 in | Wt 234.8 lb

## 2018-12-22 DIAGNOSIS — E119 Type 2 diabetes mellitus without complications: Secondary | ICD-10-CM

## 2018-12-22 DIAGNOSIS — Z87891 Personal history of nicotine dependence: Secondary | ICD-10-CM

## 2018-12-22 DIAGNOSIS — Z7984 Long term (current) use of oral hypoglycemic drugs: Secondary | ICD-10-CM

## 2018-12-22 DIAGNOSIS — Z713 Dietary counseling and surveillance: Secondary | ICD-10-CM

## 2018-12-22 LAB — POCT GLYCOSYLATED HEMOGLOBIN (HGB A1C): Hemoglobin A1C: 11 % — AB (ref 4.0–5.6)

## 2018-12-22 LAB — GLUCOSE, CAPILLARY: Glucose-Capillary: 414 mg/dL — ABNORMAL HIGH (ref 70–99)

## 2018-12-22 NOTE — Progress Notes (Signed)
Diabetes Self-Management Education  Visit Type:  Follow-up  Appt. Start Time: 0408 Appt. End Time: 0500  12/22/2018  Mr. Gracelyn Nurse, identified by name and date of birth, is a 33 y.o. male with a diagnosis of Diabetes:  .Type 2 diabetes   ASSESSMENT  Wt Readings from Last 5 Encounters:  12/22/18 234 lb 12.8 oz (106.5 kg)  09/29/18 262 lb 11.2 oz (119.2 kg)  08/19/18 256 lb 3.2 oz (116.2 kg)  06/06/18 261 lb (118.4 kg)  06/04/18 261 lb (118.4 kg)   Lab Results  Component Value Date   HGBA1C 11.0 (A) 12/22/2018   HGBA1C 7.2 (A) 09/29/2018   HGBA1C 6.0 (A) 03/31/2018   HGBA1C 5.8 (H) 11/21/2017   HGBA1C 5.8 07/01/2017      Diabetes Self-Management Education - 12/22/18 1700      Patient Education   Medications  Taught/reviewed insulin injection, site rotation, insulin storage and needle disposal.    Monitoring  Taught/evaluated SMBG meter.      Individualized Goals (developed by patient)   Medications  take my medication as prescribed    Monitoring   test my blood glucose as discussed      Patient Self-Evaluation of Goals - Patient rates self as meeting previously set goals (% of time)   Nutrition  25 - 50%      Outcomes   Program Status  Not Completed      Subsequent Visit   Since your last visit have you continued or begun to take your medications as prescribed?  Yes    Since your last visit have you had your blood pressure checked?  Yes    Is your most recent blood pressure lower, unchanged, or higher since your last visit?  Lower    Since your last visit have you experienced any weight changes?  Loss   he gained 30 and lost 30 scine our last meeting   Weight Loss (lbs)  30    Since your last visit, are you checking your blood glucose at least once a day?  No       Learning Objective:  Patient will have a greater understanding of diabetes self-management. Patient education plan is to attend individual and/or group sessions per assessed needs and  concerns.   Plan:   There are no Patient Instructions on file for this visit.   Expected Outcomes:  Demonstrated interest in learning. Expect positive outcomes  Education material provided: Diabetes Resources  If problems or questions, patient to contact team via:  Phone  Future DSME appointment: - 2 wks  Debera Lat, [place] 12/22/2018 5:21 PM.

## 2018-12-22 NOTE — Patient Instructions (Signed)
Joshua Wall.  We started you on a new medication called [place].  You will need to take 20 units every evening.  Please check your blood sugar three times daily for the next two weeks and record those values.  Please make sure to drink plenty of water and stay well hydrated.  I will need to see you back in the office in about two weeks.

## 2018-12-22 NOTE — Progress Notes (Signed)
   CC: T2DM  HPI:  [name] is a 33 y.o. male with PMH below.  Today we will address T2DM  Please see A&P for status of the patient's chronic medical conditions  Past Medical History:  Diagnosis Date  . Acute diverticulitis 11/2017  . Allergic rhinitis   . Colon polyps   . Diabetes mellitus without complication ([place])   . GERD (gastroesophageal reflux disease)   . Oropharyngeal dysphagia    Review of Systems:  ROS: Pulmonary: pt denies increased work of breathing, shortness of breath,  Cardiac: pt denies palpitations, chest pain,  Abdominal: pt denies abdominal pain, nausea, vomiting, or diarrhea    Physical Exam:  Vitals:   12/22/18 1511  BP: 130/90  Pulse: 90  Temp: 98.3 F (36.8 C)  TempSrc: Oral  SpO2: 98%  Weight: 234 lb 12.8 oz (106.5 kg)  Height: 5' 8.5" (1.74 m)   Cardiac: normal rate and rhythm, clear s1 and s2, no murmurs, rubs or gallops Pulmonary: CTAB, not in distress Abdominal: non distended abdomen, soft and nontender Extremities: no LE edema Psych: Alert, conversant, in good spirits   Social History   Socioeconomic History  . Marital status: Married    Spouse name: Not on file  . Number of children: 5  . Years of education: Not on file  . Highest education level: Not on file  Occupational History  . Not on file  Social Needs  . Financial resource strain: Not on file  . Food insecurity:    Worry: Not on file    Inability: Not on file  . Transportation needs:    Medical: Not on file    Non-medical: Not on file  Tobacco Use  . Smoking status: Former Smoker    Types: Cigarettes    Last attempt to quit: 03/06/2018    Years since quitting: 0.7  . Smokeless tobacco: Never Used  Substance and Sexual Activity  . Alcohol use: Yes    Comment: Sometimes.  . Drug use: No  . Sexual activity: Yes    Partners: Female  Lifestyle  . Physical activity:    Days per week: Not on file    Minutes per session: Not on file  . Stress: Not  on file  Relationships  . Social connections:    Talks on phone: Not on file    Gets together: Not on file    Attends religious service: Not on file    Active member of club or organization: Not on file    Attends meetings of clubs or organizations: Not on file    Relationship status: Not on file  . Intimate partner violence:    Fear of current or ex partner: Not on file    Emotionally abused: Not on file    Physically abused: Not on file    Forced sexual activity: Not on file  Other Topics Concern  . Not on file  Social History Narrative  . Not on file    No family history on file.  Assessment & Plan:   See Encounters Tab for problem based charting.  Patient discussed with Dr. Angelia Mould

## 2018-12-23 ENCOUNTER — Encounter: Payer: Self-pay | Admitting: Internal Medicine

## 2018-12-23 ENCOUNTER — Telehealth: Payer: Self-pay | Admitting: *Deleted

## 2018-12-23 ENCOUNTER — Other Ambulatory Visit: Payer: Self-pay | Admitting: Internal Medicine

## 2018-12-23 MED ORDER — GLUCOSE BLOOD VI STRP: ORAL_STRIP | 12 refills | Status: DC

## 2018-12-23 NOTE — Progress Notes (Signed)
Internal Medicine Clinic Attending  Case discussed with Dr. Winfrey  at the time of the visit.  We reviewed the resident's history and exam and pertinent patient test results.  I agree with the assessment, diagnosis, and plan of care documented in the resident's note.  

## 2018-12-23 NOTE — Telephone Encounter (Signed)
Thank you I sent the wrong brand initially but now sent in contour next brand.  We gave him a new meter in the clinic yesterday.

## 2018-12-23 NOTE — Telephone Encounter (Signed)
Fax from [facility] - need rx for Contour Next test strips qty100 along with directions. Also meter if needed. Thanks

## 2018-12-23 NOTE — Assessment & Plan Note (Addendum)
T2DM: Pt arrived with increased cbg and A1C up to 11 %.  It seems that he was unable to implement his dietary and exercise changes we discussed with him and his wife.  I discussed these changes again in detail specifically cutting out rice, juice, tortillas.  He does not drink sodas.  He has also had a substantial weight loss since his last visit likely due to insulin resistance and pancreatic glucose toxicity.  He does not look or feel ill he does endorse polyuria but has been keeping up with drinking fluids.  His vitals look good today.  He is self pay and medication affordability is being considered.  I think we will be able to get him a long acting insulin for the long term that is affordable and we will start with samples of tresiba today.  Butch Penny our diabetes educator and nutritionist was able to educate patient on how to use his long acting insulin and spoke briefly about dietary change as well.     -continue metformin 1000mg  BID -will add tresiba 20 units daily given a sample and education today -pt will check his cbg three times daily for the next two weeks and return with his progress -place consult for ongoing diabetes education

## 2018-12-24 ENCOUNTER — Other Ambulatory Visit: Payer: Self-pay | Admitting: *Deleted

## 2018-12-24 NOTE — Telephone Encounter (Signed)
Please include instructions, how often pt should be testing and dx code. Thanks

## 2018-12-25 MED ORDER — GLUCOSE BLOOD VI STRP: ORAL_STRIP | 12 refills | Status: AC

## 2018-12-25 NOTE — Telephone Encounter (Signed)
Refilled, changed to [employer]

## 2019-01-06 ENCOUNTER — Ambulatory Visit: Payer: Self-pay

## 2019-01-06 ENCOUNTER — Ambulatory Visit: Payer: Self-pay | Admitting: Dietician

## 2019-01-12 ENCOUNTER — Ambulatory Visit (INDEPENDENT_AMBULATORY_CARE_PROVIDER_SITE_OTHER): Payer: Self-pay | Admitting: Internal Medicine

## 2019-01-12 ENCOUNTER — Ambulatory Visit (INDEPENDENT_AMBULATORY_CARE_PROVIDER_SITE_OTHER): Payer: Self-pay | Admitting: Dietician

## 2019-01-12 ENCOUNTER — Encounter: Payer: Self-pay | Admitting: Internal Medicine

## 2019-01-12 ENCOUNTER — Other Ambulatory Visit: Payer: Self-pay

## 2019-01-12 ENCOUNTER — Encounter: Payer: Self-pay | Admitting: Dietician

## 2019-01-12 VITALS — BP 126/79 | HR 79 | Temp 98.2°F | Ht 68.5 in | Wt 233.3 lb

## 2019-01-12 DIAGNOSIS — Z79899 Other long term (current) drug therapy: Secondary | ICD-10-CM

## 2019-01-12 DIAGNOSIS — E119 Type 2 diabetes mellitus without complications: Secondary | ICD-10-CM

## 2019-01-12 DIAGNOSIS — Z794 Long term (current) use of insulin: Secondary | ICD-10-CM

## 2019-01-12 DIAGNOSIS — K219 Gastro-esophageal reflux disease without esophagitis: Secondary | ICD-10-CM

## 2019-01-12 DIAGNOSIS — Z713 Dietary counseling and surveillance: Secondary | ICD-10-CM

## 2019-01-12 MED ORDER — INSULIN GLARGINE 100 UNIT/ML SOLOSTAR PEN: 20.0000 [IU] | PEN_INJECTOR | Freq: Every day | SUBCUTANEOUS | 2 refills | Status: DC

## 2019-01-12 MED ORDER — SITAGLIP PHOS-METFORMIN HCL ER 100-1000 MG PO TB24: 1.0000 | ORAL_TABLET | Freq: Every day | ORAL | 2 refills | Status: DC

## 2019-01-12 MED ORDER — INSULIN DEGLUDEC 100 UNIT/ML ~~LOC~~ SOPN: 20.0000 [IU] | PEN_INJECTOR | Freq: Every day | SUBCUTANEOUS | Status: DC

## 2019-01-12 NOTE — Progress Notes (Signed)
   CC: diabetes  HPI:  [name] is a 33 y.o. male with DM2, gerd presents for diabetes f/u. He was started on Tresiba 20U qhs one month ago and reports good compliance with that as well as metformin 1000mg  once daily (despite it being prescribed bid). He has cut back on rice, juice, and tortillas. His meter shows CBGs in the 200s and 300s. He reports checking his sugar after he eats.   Denies polyuria, polydipsia, diaphoresis, dizziness, n/v/d.   Past Medical History:  Diagnosis Date  . Acute diverticulitis 11/2017  . Allergic rhinitis   . Colon polyps   . Diabetes mellitus without complication ([place])   . GERD (gastroesophageal reflux disease)   . Oropharyngeal dysphagia     Physical Exam:  Vitals:   01/12/19 1454  BP: 126/79  Pulse: 79  Temp: 98.2 F (36.8 C)  TempSrc: Oral  SpO2: 98%  Weight: 233 lb 4.8 oz (105.8 kg)  Height: 5' 8.5" (1.74 m)   Gen: well appearing, NAD Cardiac: RRR, no m/r/g Pulm: CTAB  Assessment & Plan:   See Encounters Tab for problem based charting.  Patient discussed with Dr. Lynnae January

## 2019-01-12 NOTE — Assessment & Plan Note (Addendum)
Compliant with [place] Aas 20U daily, he takes it in the morning or at night depending on when he remembers. Also taking metformin 1000mg  qd. CBGs are post-prandial but remain elevated in the 200s and 300s. Denies symptomatic hypoglycemia. He remains uninsured. He has 10 days of tresiba left. Given options on the [employer] $4 list, we will start lantus once he runs out of tresiba. Go ahead and d/c metformin and start janumet qd.   - prescribing meds on [place] $4 list  - start lantus 20U once he runs out of the sample tresiba given at last visit - he needs postprandial coverage. Will decrease pill burden by starting janumet 100-1000mg  qd - d/c metformin since starting janumet  - check CBGs before each meal and again at bedtime - f/u 1 month

## 2019-01-12 NOTE — Patient Instructions (Addendum)
   Estimado seor Hernndez:  Hice una cita para recibir educacin y 59 sobre el autocontrol de la diabetes conmigo despus de ver al mdico el 7 de julio. Solo hay unos pocos temas ms para repasar. Espero que est bien. Si no, no dude en cancelarlo.  Ria Clock    [phone]

## 2019-01-12 NOTE — Progress Notes (Signed)
Diabetes Self-Management Education  Visit Type:  (P) Follow-up  Appt. Start Time: 1545 Appt. End Time: 0223  01/12/2019  Joshua Wall Nurse, identified by name and date of birth, is a 33 y.o. male with a diagnosis of Diabetes:  Type 21 month.   ASSESSMENT  Lab Results  Component Value Date   HGBA1C 11.0 (A) 12/22/2018   HGBA1C 7.2 (A) 09/29/2018   HGBA1C 6.0 (A) 03/31/2018      Wt Readings from Last 5 Encounters:  01/12/19 233 lb 4.8 oz (105.8 kg)  12/22/18 234 lb 12.8 oz (106.5 kg)  09/29/18 262 lb 11.2 oz (119.2 kg)  08/19/18 256 lb 3.2 oz (116.2 kg)  06/06/18 261 lb (118.4 kg)    Learning Objective:  Patient will have a greater understanding of diabetes self-management. Patient education plan is to attend individual and/or group sessions per assessed needs and concerns.   Plan:   There are no Patient Instructions on file for this visit.   Expected Outcomes:     Education material provided: Carbohydrate counting sheet  If problems or questions, patient to contact team via:  Phone  Future DSME appointment: -   1 month Joshua Wall, RD 01/12/2019 4:52 PM.

## 2019-01-12 NOTE — Patient Instructions (Addendum)
It was nice seeing you today. Thank you for choosing [facility] for your Primary Care.   Today we talked about:   1) Diabetes: keep up the good work!  - Continue taking [place] 20U daily, when you run out of this you can start taking Lantus 20U daily at bedtime   - STOP metformin  - START Janumet one pill daily   - check your blood sugar before each meal and again at bedtime   1) Diabetes: sigan con el buen trabajo! - Contine tomando [place] 20U diariamente, cuando se quede sin esto, puede comenzar a tomar Lantus 20U diariamente antes de acostarse - [place] - revise su nivel de azcar en la sangre antes de cada comida y nuevamente al acostarse   FOLLOW-UP INSTRUCTIONS When: 1 month For: diabetes What to bring: glucometer   Please contact the clinic if you have any problems, or need to be seen sooner.

## 2019-01-16 NOTE — Progress Notes (Signed)
Internal Medicine Clinic Attending  Case discussed with Dr. Vogel  at the time of the visit.  We reviewed the resident's history and exam and pertinent patient test results.  I agree with the assessment, diagnosis, and plan of care documented in the resident's note.  

## 2019-02-10 ENCOUNTER — Encounter: Payer: Self-pay | Admitting: Internal Medicine

## 2019-02-10 ENCOUNTER — Ambulatory Visit: Payer: Self-pay | Admitting: Dietician

## 2019-02-10 NOTE — Progress Notes (Deleted)
   CC: ***  HPI:  JoshuaJoshua Wall is a 33 y.o. male with PMH below.  Today we will address ***  T2DM:  Hyperglycemic in May his diabetes had progressed significantly and he was started on 20 units tresiba samples.  Seen in [place] one month ago switched to lantus 20 units and janumet for affordability.  Reported better dietary choices at that time.  Today  Please see A&P for status of the patient's chronic medical conditions  Past Medical History:  Diagnosis Date  . Acute diverticulitis 11/2017  . Allergic rhinitis   . Colon polyps   . Diabetes mellitus without complication ([place])   . GERD (gastroesophageal reflux disease)   . Oropharyngeal dysphagia    Review of Systems:  ***  Physical Exam:  There were no vitals filed for this visit. ***  Social History   Socioeconomic History  . Marital status: Married    Spouse name: Not on file  . Number of children: 5  . Years of education: Not on file  . Highest education level: Not on file  Occupational History  . Not on file  Social Needs  . Financial resource strain: Not on file  . Food insecurity    Worry: Not on file    Inability: Not on file  . Transportation needs    Medical: Not on file    Non-medical: Not on file  Tobacco Use  . Smoking status: Current Some Day Smoker    Types: Cigarettes    Last attempt to quit: 03/06/2018    Years since quitting: 0.9  . Smokeless tobacco: Never Used  . Tobacco comment: 3 cigerrettes per day sometimes   Substance and Sexual Activity  . Alcohol use: Yes    Comment: Sometimes.  . Drug use: No  . Sexual activity: Yes    Partners: Female  Lifestyle  . Physical activity    Days per week: Not on file    Minutes per session: Not on file  . Stress: Not on file  Relationships  . Social Herbalist on phone: Not on file    Gets together: Not on file    Attends religious service: Not on file    Active member of club or organization: Not on file    Attends meetings of  clubs or organizations: Not on file    Relationship status: Not on file  . Intimate partner violence    Fear of current or ex partner: Not on file    Emotionally abused: Not on file    Physically abused: Not on file    Forced sexual activity: Not on file  Other Topics Concern  . Not on file  Social History Narrative  . Not on file   *** No family history on file.  Assessment & Plan:   See Encounters Tab for problem based charting.  Patient {GC/GE:3044014::"discussed with","seen with"} Dr. {NAMES:[id number]::"Butcher","Granfortuna","E. Hoffman","Klima","Mullen","Narendra","Raines","Vincent"}

## 2019-02-24 ENCOUNTER — Encounter: Payer: Self-pay | Admitting: Internal Medicine

## 2019-03-08 NOTE — Progress Notes (Signed)
   CC: T2DM, HLD  HPI:  [name] is a 33 y.o. male with PMH below.  Today we will address T2DM  Please see A&P for status of the patient's chronic medical conditions  Past Medical History:  Diagnosis Date  . Acute diverticulitis 11/2017  . Allergic rhinitis   . Colon polyps   . Diabetes mellitus without complication ([place])   . GERD (gastroesophageal reflux disease)   . Oropharyngeal dysphagia    Review of Systems:  ROS: Pulmonary: pt denies increased work of breathing, shortness of breath,  Cardiac: pt denies palpitations, chest pain,  Abdominal: pt denies abdominal pain, nausea, vomiting, or diarrhea   Physical Exam:  Vitals:   03/16/19 1326  BP: 128/72  Pulse: 74  Temp: 98.3 F (36.8 C)  TempSrc: Oral  SpO2: 99%  Weight: 242 lb (109.8 kg)   Cardiac: normal rate and rhythm, clear s1 and s2, no murmurs, rubs or gallops, no LE edema Pulmonary: CTAB, not in distress Abdominal: non distended abdomen, soft and nontender Psych: Alert, conversant, in good spirits   Social History   Socioeconomic History  . Marital status: Married    Spouse name: Not on file  . Number of children: 5  . Years of education: Not on file  . Highest education level: Not on file  Occupational History  . Not on file  Social Needs  . Financial resource strain: Not on file  . Food insecurity    Worry: Not on file    Inability: Not on file  . Transportation needs    Medical: Not on file    Non-medical: Not on file  Tobacco Use  . Smoking status: Current Some Day Smoker    Types: Cigarettes    Last attempt to quit: 03/06/2018    Years since quitting: 1.0  . Smokeless tobacco: Never Used  . Tobacco comment: 3 cigerrettes per day sometimes   Substance and Sexual Activity  . Alcohol use: Yes    Comment: Sometimes.  . Drug use: No  . Sexual activity: Yes    Partners: Female  Lifestyle  . Physical activity    Days per week: Not on file    Minutes per session: Not on file   . Stress: Not on file  Relationships  . Social Herbalist on phone: Not on file    Gets together: Not on file    Attends religious service: Not on file    Active member of club or organization: Not on file    Attends meetings of clubs or organizations: Not on file    Relationship status: Not on file  . Intimate partner violence    Fear of current or ex partner: Not on file    Emotionally abused: Not on file    Physically abused: Not on file    Forced sexual activity: Not on file  Other Topics Concern  . Not on file  Social History Narrative  . Not on file    No family history on file.  Assessment & Plan:   See Encounters Tab for problem based charting.  Patient discussed with Dr. Rebeca Alert

## 2019-03-16 ENCOUNTER — Encounter (INDEPENDENT_AMBULATORY_CARE_PROVIDER_SITE_OTHER): Payer: Self-pay

## 2019-03-16 ENCOUNTER — Ambulatory Visit (INDEPENDENT_AMBULATORY_CARE_PROVIDER_SITE_OTHER): Payer: Self-pay | Admitting: Dietician

## 2019-03-16 ENCOUNTER — Ambulatory Visit (INDEPENDENT_AMBULATORY_CARE_PROVIDER_SITE_OTHER): Payer: Self-pay | Admitting: Internal Medicine

## 2019-03-16 ENCOUNTER — Encounter: Payer: Self-pay | Admitting: Internal Medicine

## 2019-03-16 ENCOUNTER — Other Ambulatory Visit: Payer: Self-pay

## 2019-03-16 VITALS — BP 128/72 | HR 74 | Temp 98.3°F | Wt 242.0 lb

## 2019-03-16 DIAGNOSIS — E119 Type 2 diabetes mellitus without complications: Secondary | ICD-10-CM

## 2019-03-16 DIAGNOSIS — E785 Hyperlipidemia, unspecified: Secondary | ICD-10-CM

## 2019-03-16 DIAGNOSIS — Z713 Dietary counseling and surveillance: Secondary | ICD-10-CM

## 2019-03-16 DIAGNOSIS — Z79899 Other long term (current) drug therapy: Secondary | ICD-10-CM

## 2019-03-16 DIAGNOSIS — Z794 Long term (current) use of insulin: Secondary | ICD-10-CM

## 2019-03-16 DIAGNOSIS — Z720 Tobacco use: Secondary | ICD-10-CM

## 2019-03-16 DIAGNOSIS — E1169 Type 2 diabetes mellitus with other specified complication: Secondary | ICD-10-CM

## 2019-03-16 MED ORDER — LANTUS SOLOSTAR 100 UNIT/ML ~~LOC~~ SOPN: 23.0000 [IU] | PEN_INJECTOR | Freq: Every day | SUBCUTANEOUS | 1 refills | Status: AC

## 2019-03-16 MED ORDER — EZETIMIBE 10 MG PO TABS: 10.0000 mg | ORAL_TABLET | Freq: Every day | ORAL | 1 refills | Status: AC

## 2019-03-16 MED ORDER — JANUMET XR 100-1000 MG PO TB24: 1.0000 | ORAL_TABLET | Freq: Every day | ORAL | 2 refills | Status: AC

## 2019-03-16 NOTE — Progress Notes (Signed)
Diabetes Self-Management Education  Visit Type: Follow-up  Appt. Start Time: 1405 Appt. End Time: 1500  03/16/2019  Mr. Gracelyn Nurse, identified by name and date of birth, is a 33 y.o. male with a diagnosis of Diabetes:  Marland Kitchen Type 2  ASSESSMENT  He tells me today he got frustrated that his blood sugars were always high so he stopped checking them.   Wt Readings from Last 5 Encounters:  03/16/19 242 lb (109.8 kg)  01/12/19 233 lb 4.8 oz (105.8 kg)  12/22/18 234 lb 12.8 oz (106.5 kg)  09/29/18 262 lb 11.2 oz (119.2 kg)  08/19/18 256 lb 3.2 oz (116.2 kg)   Lab Results  Component Value Date   HGBA1C 11.0 (A) 12/22/2018   HGBA1C 7.2 (A) 09/29/2018   HGBA1C 6.0 (A) 03/31/2018   HGBA1C 5.8 (H) 11/21/2017   HGBA1C 5.8 07/01/2017      Diabetes Self-Management Education - 03/16/19 1500      Visit Information   Visit Type  Follow-up      Health Coping   How would you rate your overall health?  Good      Complications   Number of hypoglycemic episodes per month  0    Number of hyperglycemic episodes per week  4    Can you tell when your blood sugar is high?  No    Have you had a dilated eye exam in the past 12 months?  No   requests a referral   Have you had a dental exam in the past 12 months?  Yes   was going to [facility], they closed-needs molar pulled   Are you checking your feet?  Yes    How many days per week are you checking your feet?  7   reports he has had an ingrown nail and athletes foot in past     Dietary Intake   Breakfast  cereal and yogurt    Lunch  rice, fish or chicken, salad from home    Dinner  bread and milk    Beverage(s)  water, diet soda, sugar free lemonade, 4-8 oz orange juice with water in it.    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   discussed steps per day recommendations and how to track   Chronic complications  Relationship between chronic complications and blood glucose control;Dental care;Retinopathy and reason for yearly dilated eye exams      Individualized Goals (developed by patient)   Monitoring   test my blood glucose as discussed      Patient Self-Evaluation of Goals - Patient rates self as meeting previously set goals (% of time)   Monitoring  25 - 50%      Outcomes   Expected Outcomes  Demonstrated interest in learning. Expect positive outcomes    Future DMSE  2 months    Program Status  Completed      Subsequent Visit   Since your last visit have you continued or begun to take your medications as prescribed?  Yes    Since your last visit have you experienced any weight changes?  Gain    Weight Gain (lbs)  10    Since your last visit, are you checking your blood glucose at least once a day?  No       Individualized Plan for Diabetes Self-Management Training:   Learning Objective:  Patient will have a greater understanding of diabetes self-management. Patient education plan is to attend individual and/or group sessions per assessed needs and concerns.   Plan:   There are no Patient Instructions on file for this visit.  Expected Outcomes:  Demonstrated interest in learning. Expect positive outcomes Education material provided: staying off the roller coaster handout, step counter handout If problems or questions, patient to contact team via:  Phone Future DSME appointment: 2 months  Debera Lat, RD 03/16/2019 3:58 PM.

## 2019-03-16 NOTE — Assessment & Plan Note (Signed)
T2DM: Pt initially was doing well on metformin however gained some more weight and was eating poorly.   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  Was then started on lantus 20 units daily.  Seen for follow up in [facility] still not well controlled and he was switched to janumet.  He reports improvement in cbg readings after being on insulin and janumet and working on dietary changes.  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  He brought his glucometer in and this supports his subjective improvement.  55% of his readings are within goal with no lows.  All of the 55% are toward the end of July early August.  July started out in the mid 200's on average and progressively lower to 160, 170, 133 etc averaging about 160.    -increase lantus to 23u daily -he will start walking in the evenings with his wife -has appointment with our dietician after this visit -one month follow up

## 2019-03-16 NOTE — Assessment & Plan Note (Signed)
HLD: Multiple statin intolerance.  Needs continued weight loss and dietary change. Unfortunately still gaining weight partly explained by adding insulin. Discussed starting zetia.  -start pt on zetia 10mg  daily   -he will start walking with his wife in the evenings

## 2019-03-16 NOTE — Patient Instructions (Signed)
Nos aumentar la insulina lantus a veinte tres unidades.  por favor chequiar asucar mas frequente por lo menos dos veces al dia.

## 2019-03-17 NOTE — Progress Notes (Signed)
Internal Medicine Clinic Attending  Case discussed with Dr. Winfrey at the time of the visit.  We reviewed the resident's history and exam and pertinent patient test results.  I agree with the assessment, diagnosis, and plan of care documented in the resident's note.  Alexander Raines, M.D., Ph.D.  

## 2019-03-20 ENCOUNTER — Encounter: Payer: Self-pay | Admitting: Internal Medicine

## 2019-04-06 ENCOUNTER — Encounter: Payer: Self-pay | Admitting: Internal Medicine

## 2019-04-06 ENCOUNTER — Ambulatory Visit: Payer: Self-pay

## 2019-04-20 ENCOUNTER — Encounter: Payer: Self-pay | Admitting: Internal Medicine

## 2019-11-26 ENCOUNTER — Encounter: Payer: Self-pay | Admitting: *Deleted

## 2021-01-08 ENCOUNTER — Encounter: Payer: Self-pay | Admitting: *Deleted
